# Patient Record
Sex: Female | Born: 1985 | Race: White | Hispanic: No | Marital: Married | Smoking: Never smoker
Health system: Southern US, Community
[De-identification: ages and names within clinical notes are randomized; demographics above are authoritative.]

## PROBLEM LIST (undated history)

## (undated) ENCOUNTER — Inpatient Hospital Stay: Payer: Self-pay

## (undated) DIAGNOSIS — I10 Essential (primary) hypertension: Secondary | ICD-10-CM

## (undated) DIAGNOSIS — F419 Anxiety disorder, unspecified: Secondary | ICD-10-CM

## (undated) HISTORY — PX: TONSILLECTOMY: SUR1361

---

## 2006-08-24 ENCOUNTER — Ambulatory Visit (HOSPITAL_COMMUNITY): Admission: RE | Admit: 2006-08-24 | Discharge: 2006-08-24 | Payer: Self-pay | Admitting: Obstetrics and Gynecology

## 2008-10-14 ENCOUNTER — Ambulatory Visit (HOSPITAL_COMMUNITY): Admission: RE | Admit: 2008-10-14 | Discharge: 2008-10-14 | Payer: Self-pay | Admitting: Obstetrics and Gynecology

## 2008-11-11 ENCOUNTER — Ambulatory Visit (HOSPITAL_COMMUNITY): Admission: RE | Admit: 2008-11-11 | Discharge: 2008-11-11 | Payer: Self-pay | Admitting: Obstetrics and Gynecology

## 2008-11-19 ENCOUNTER — Ambulatory Visit (HOSPITAL_COMMUNITY): Admission: RE | Admit: 2008-11-19 | Discharge: 2008-11-19 | Payer: Self-pay | Admitting: Obstetrics and Gynecology

## 2008-12-17 ENCOUNTER — Ambulatory Visit (HOSPITAL_COMMUNITY): Admission: RE | Admit: 2008-12-17 | Discharge: 2008-12-17 | Payer: Self-pay | Admitting: Obstetrics and Gynecology

## 2009-01-28 ENCOUNTER — Ambulatory Visit (HOSPITAL_COMMUNITY): Admission: RE | Admit: 2009-01-28 | Discharge: 2009-01-28 | Payer: Self-pay | Admitting: Obstetrics and Gynecology

## 2009-02-12 ENCOUNTER — Ambulatory Visit (HOSPITAL_COMMUNITY): Admission: RE | Admit: 2009-02-12 | Discharge: 2009-02-12 | Payer: Self-pay | Admitting: Obstetrics and Gynecology

## 2010-06-29 ENCOUNTER — Encounter: Payer: Self-pay | Admitting: Obstetrics and Gynecology

## 2010-12-03 ENCOUNTER — Encounter: Payer: Self-pay | Admitting: Obstetrics and Gynecology

## 2010-12-21 ENCOUNTER — Encounter: Payer: Self-pay | Admitting: Maternal and Fetal Medicine

## 2011-02-22 ENCOUNTER — Encounter: Payer: Self-pay | Admitting: Maternal & Fetal Medicine

## 2011-05-20 ENCOUNTER — Ambulatory Visit: Payer: Self-pay | Admitting: Obstetrics and Gynecology

## 2011-05-21 ENCOUNTER — Ambulatory Visit: Payer: Self-pay | Admitting: Obstetrics and Gynecology

## 2011-06-18 ENCOUNTER — Ambulatory Visit: Payer: Self-pay

## 2011-06-18 LAB — CBC WITH DIFFERENTIAL/PLATELET
Basophil %: 0.4 %
Eosinophil #: 0.1 10*3/uL (ref 0.0–0.7)
Eosinophil %: 1 %
HGB: 11.8 g/dL — ABNORMAL LOW (ref 12.0–16.0)
Lymphocyte #: 1.6 10*3/uL (ref 1.0–3.6)
MCH: 29.6 pg (ref 26.0–34.0)
MCHC: 33.9 g/dL (ref 32.0–36.0)
MCV: 87 fL (ref 80–100)
Monocyte #: 0.5 10*3/uL (ref 0.0–0.7)
Neutrophil #: 4.3 10*3/uL (ref 1.4–6.5)
RBC: 3.99 10*6/uL (ref 3.80–5.20)
WBC: 6.5 10*3/uL (ref 3.6–11.0)

## 2011-06-24 ENCOUNTER — Inpatient Hospital Stay: Payer: Self-pay

## 2011-06-24 LAB — CBC WITH DIFFERENTIAL/PLATELET
Basophil %: 0.4 %
Eosinophil #: 0 10*3/uL (ref 0.0–0.7)
Eosinophil %: 0.2 %
HCT: 35.1 % (ref 35.0–47.0)
HGB: 11.9 g/dL — ABNORMAL LOW (ref 12.0–16.0)
Lymphocyte %: 25.8 %
MCH: 29.5 pg (ref 26.0–34.0)
MCHC: 33.8 g/dL (ref 32.0–36.0)
Monocyte #: 0.4 10*3/uL (ref 0.0–0.7)
Neutrophil #: 4.9 10*3/uL (ref 1.4–6.5)
Neutrophil %: 68.3 %
RBC: 4.03 10*6/uL (ref 3.80–5.20)

## 2011-06-25 LAB — HEMATOCRIT: HCT: 29 % — ABNORMAL LOW (ref 35.0–47.0)

## 2012-02-26 IMAGING — US ULTRAOUND OB LIMITED - NRPT [facility]
1 series · 14 of 28 positions shown · non-contrast
Comparison: none

[Series 1: ultraound ob limited - nrpt [facility] · 14 of 31 slices shown]
[im 2/31]
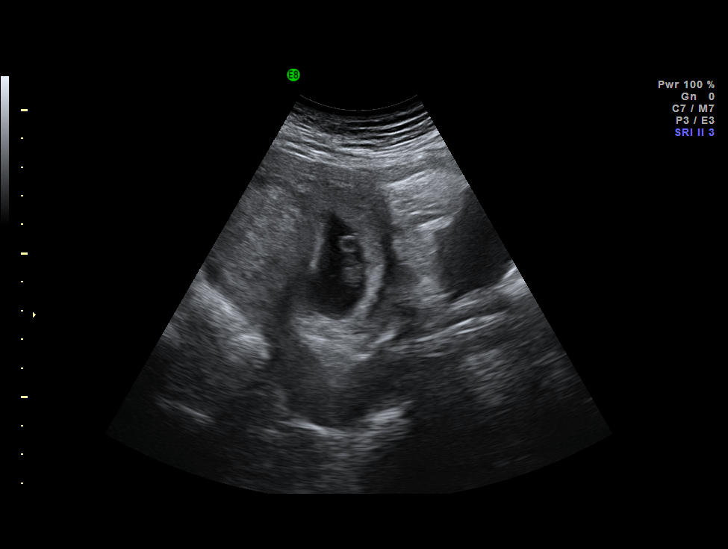
[im 4/31]
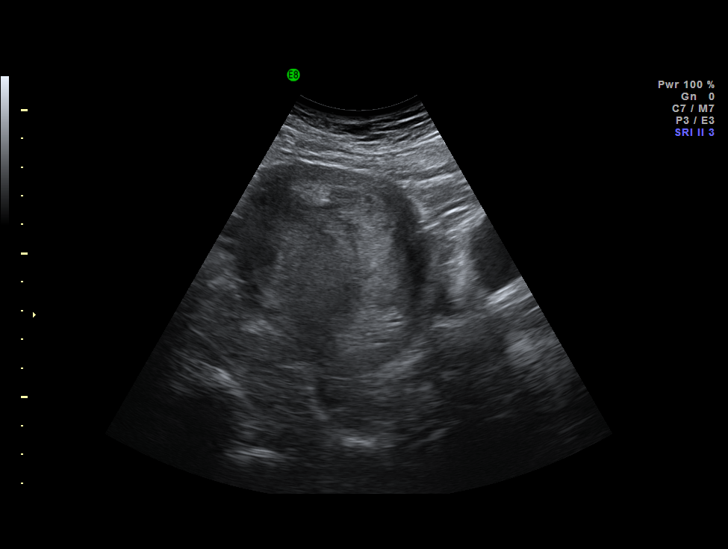
[im 6/31]
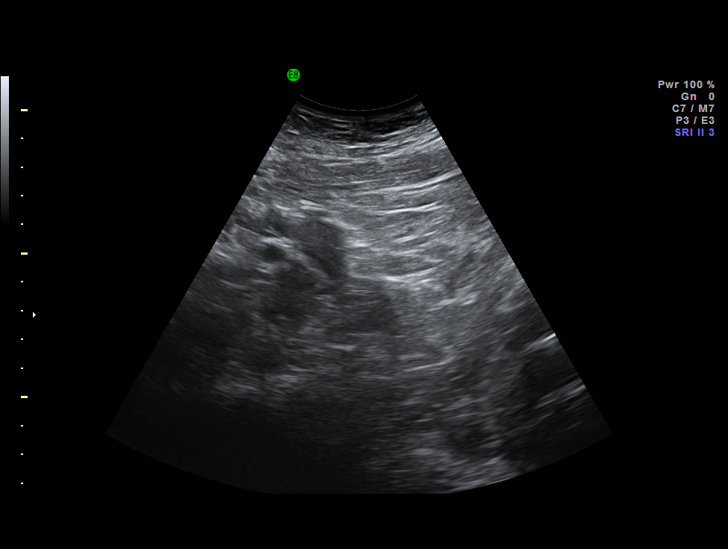
[im 8/31]
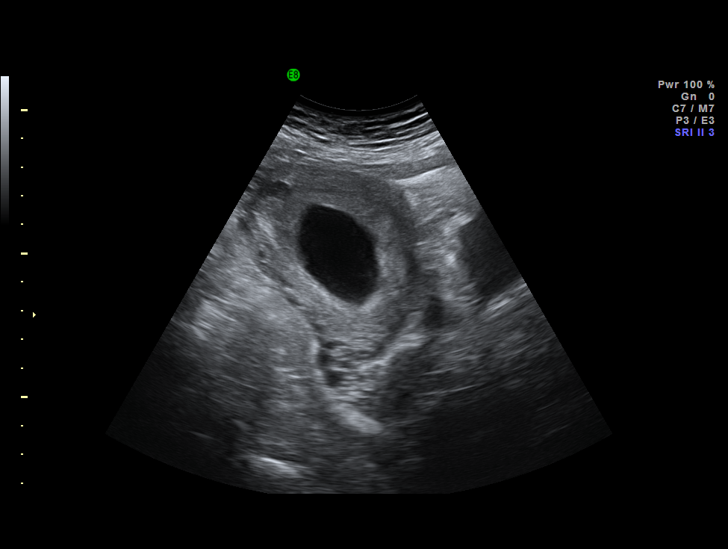
[im 11/31]
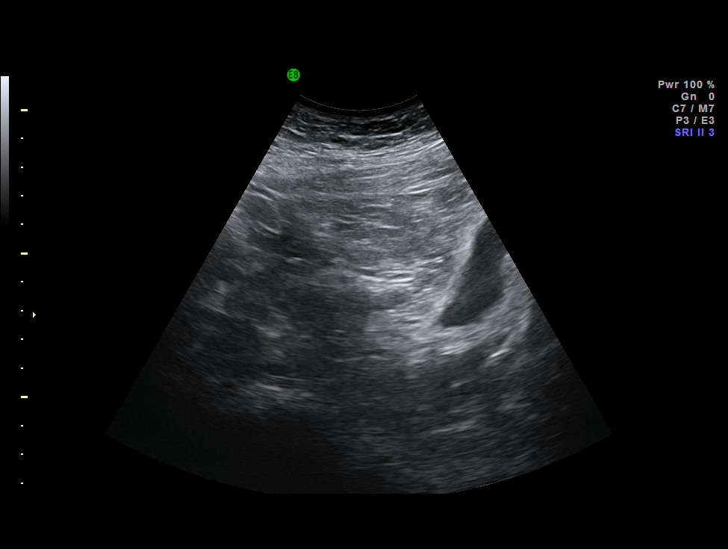
[im 13/31]
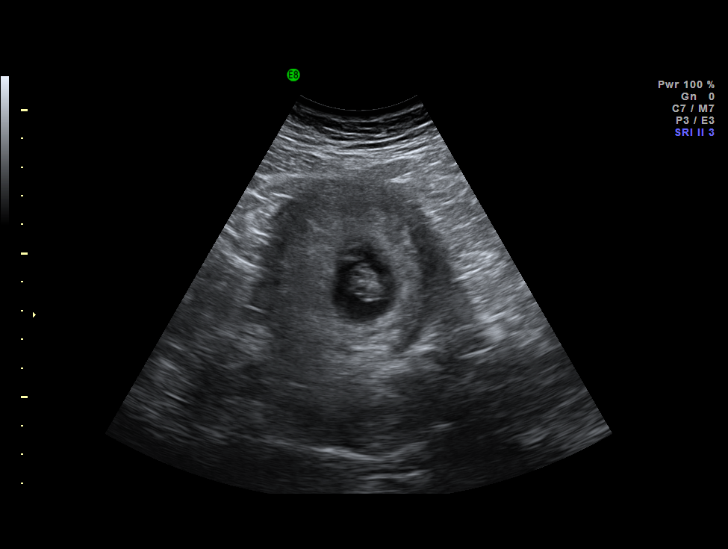
[im 15/31]
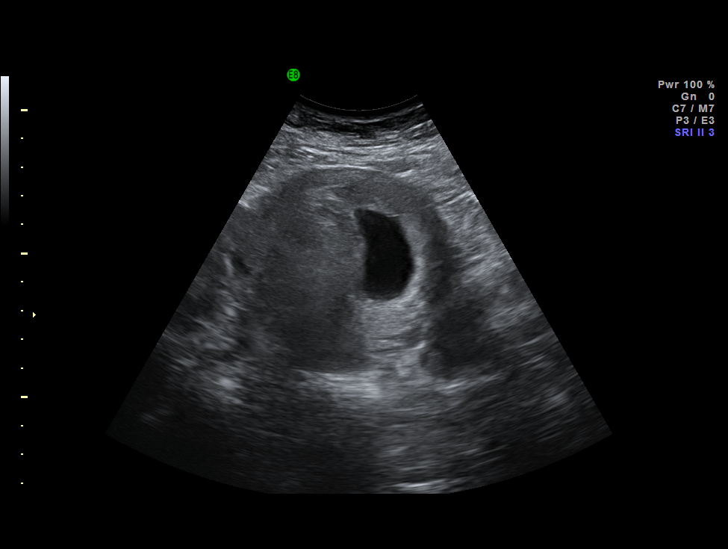
[im 17/31]
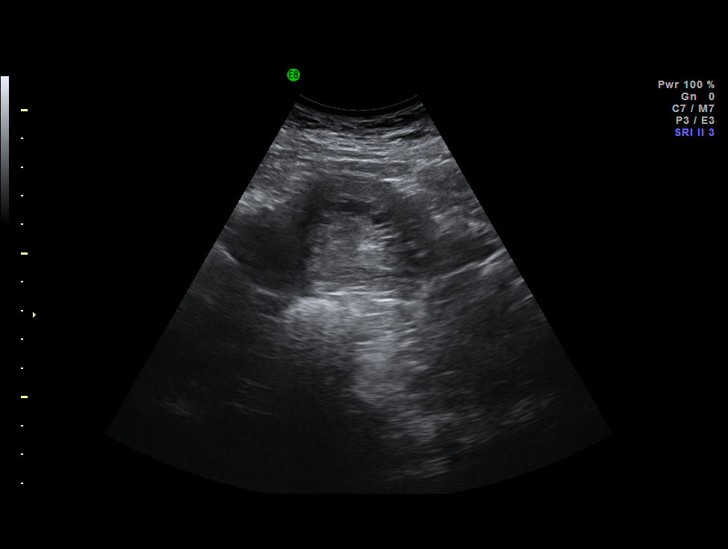
[im 19/31]
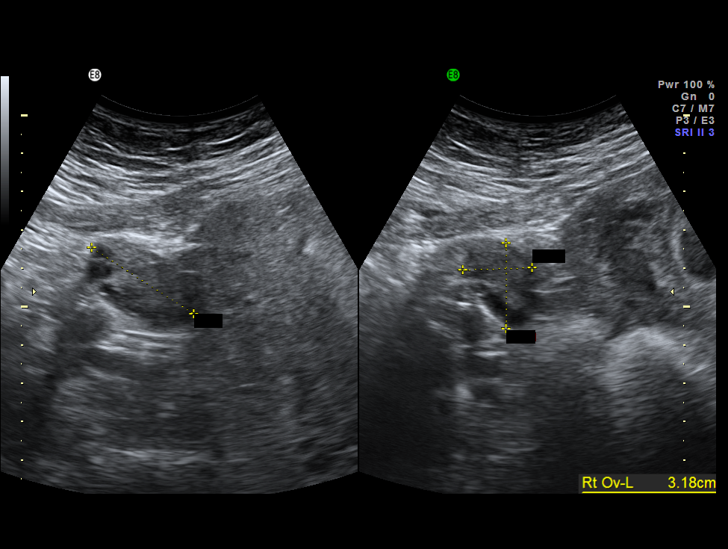
[im 22/31]
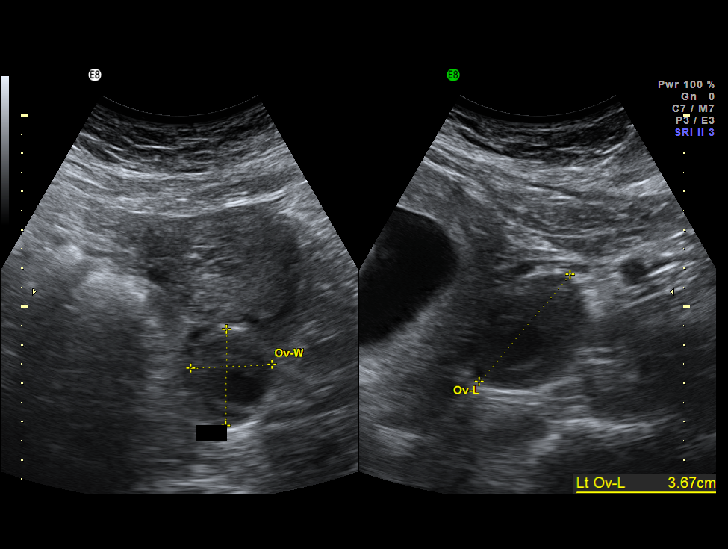
[im 24/31]
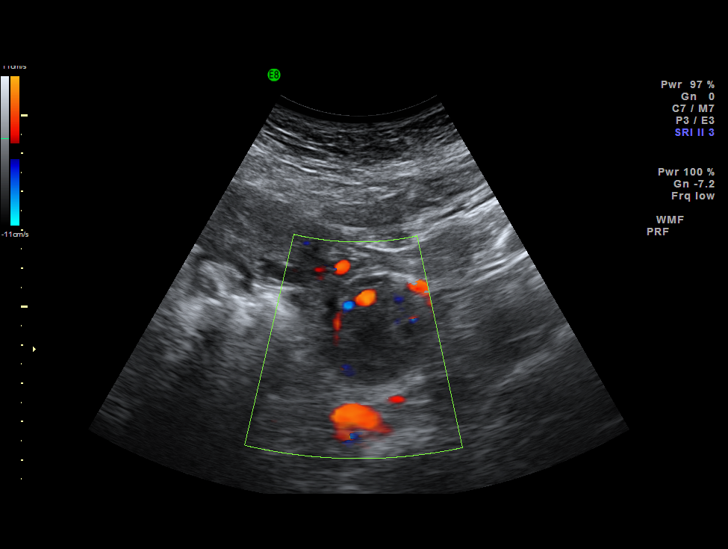
[im 26/31]
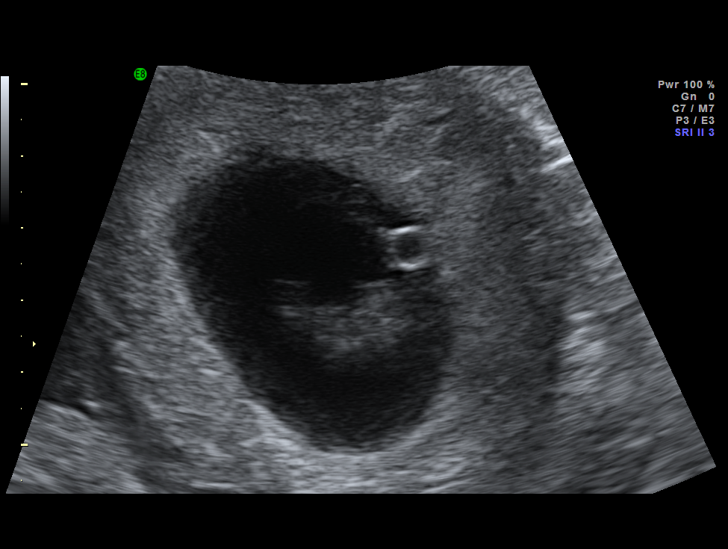
[im 28/31]
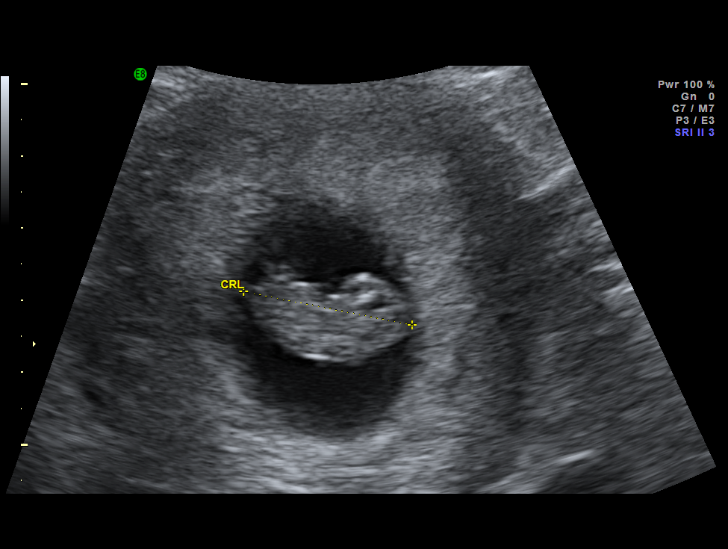
[im 31/31]
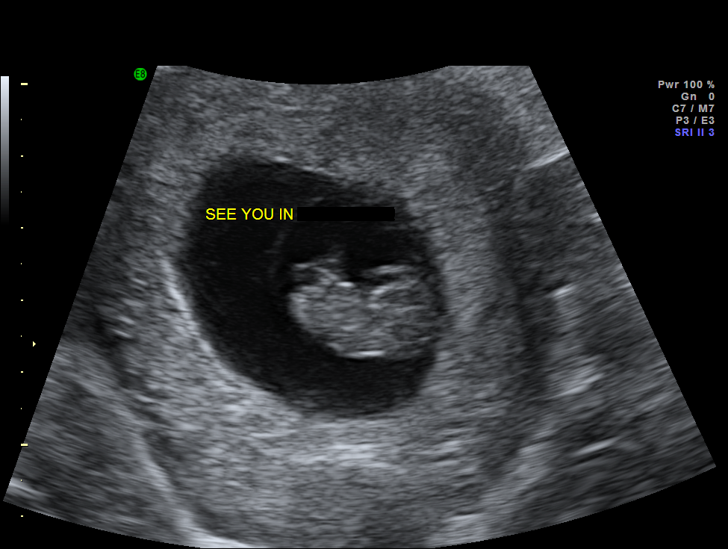

[14 of 28 positions shown; findings below may reference images not displayed]

IMAGES IMPORTED FROM THE [employer] WORKFLOW SYSTEM
NO DICTATION FOR STUDY

## 2012-04-29 IMAGING — US US OB DETAIL+14 WK - NRPT [facility]
1 series · 14 of 28 positions shown · non-contrast
Comparison: none

[Series 1: us ob detail+14 wk - nrpt [facility] · 14 of 86 slices shown]
[im 4/86]
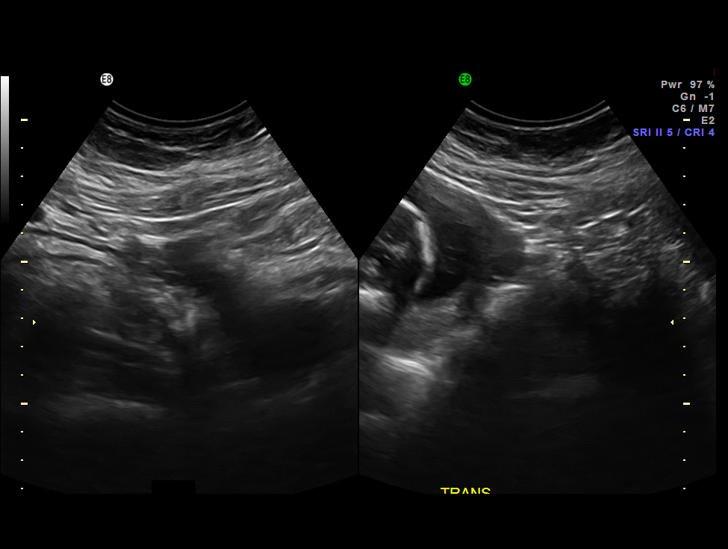
[im 10/86]
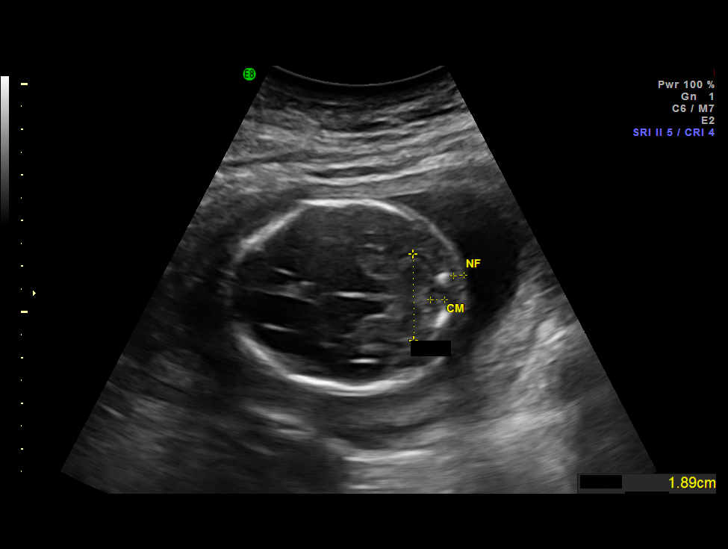
[im 16/86]
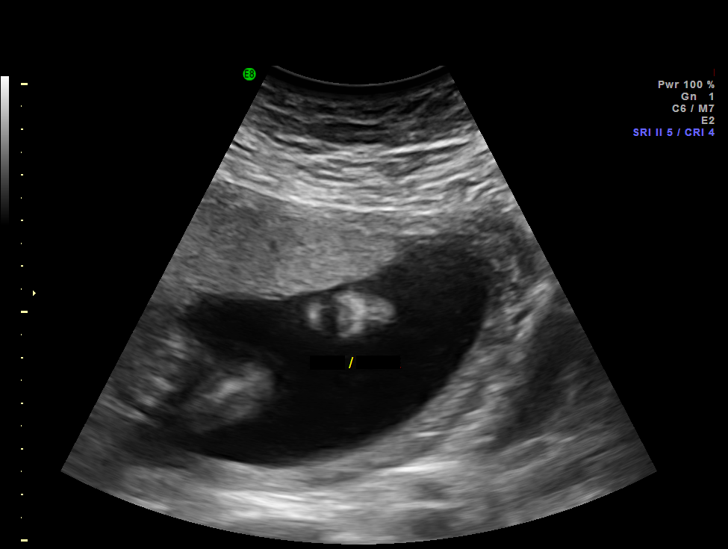
[im 23/86]
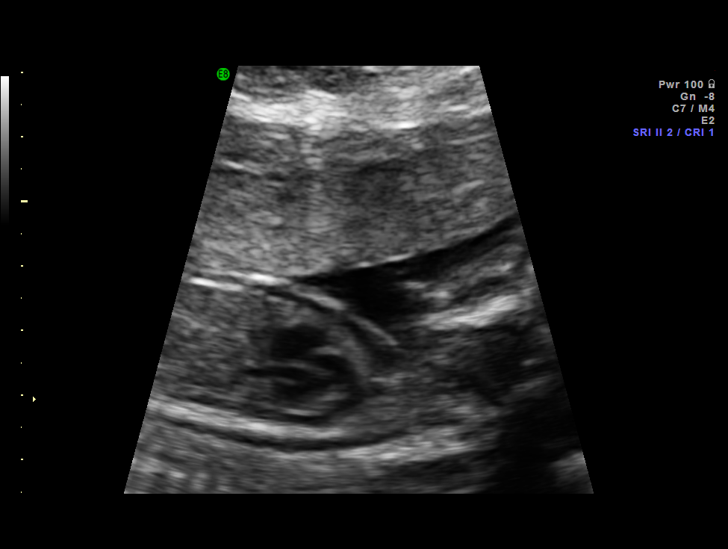
[im 29/86]
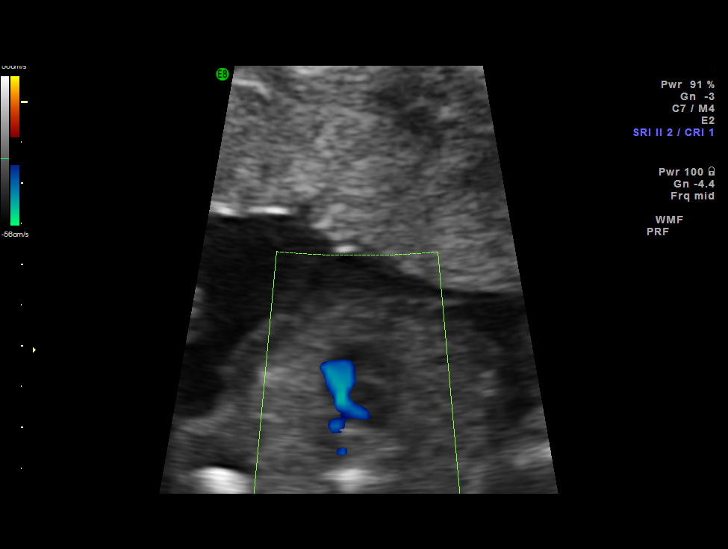
[im 35/86]
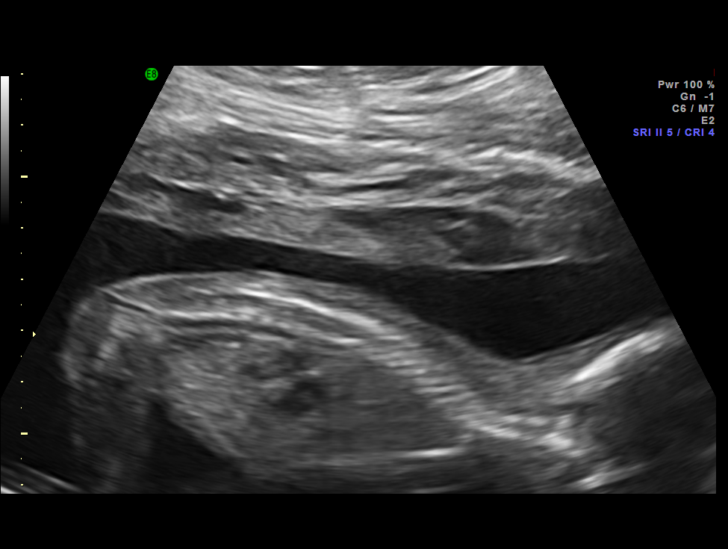
[im 41/86]
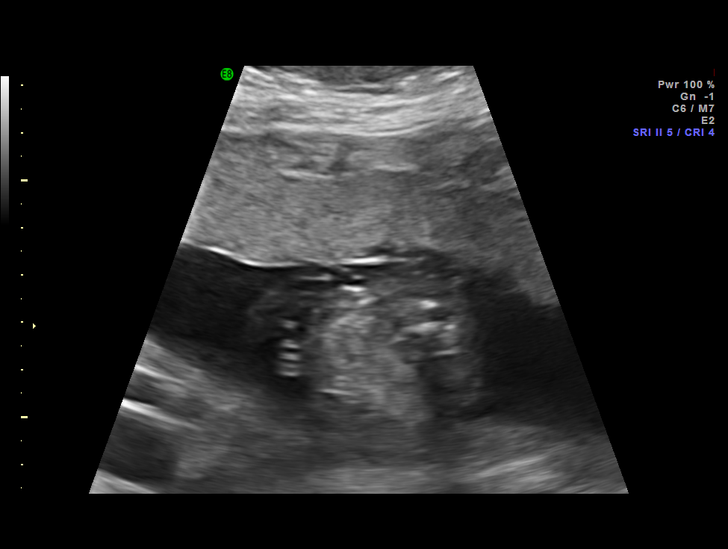
[im 48/86]
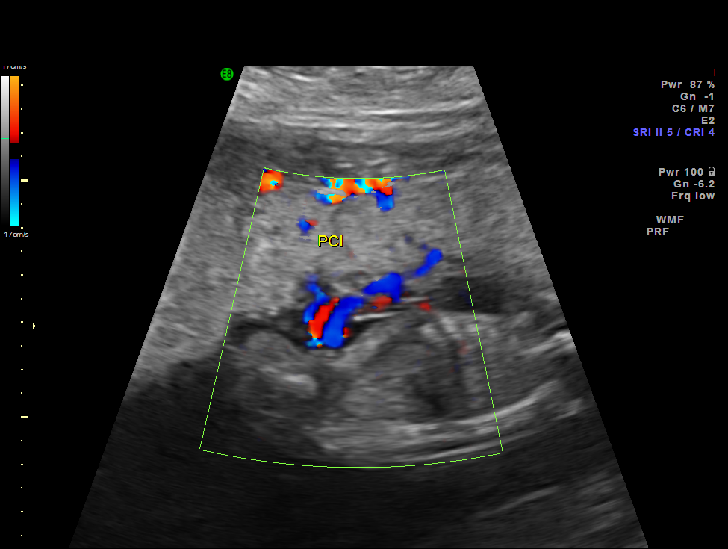
[im 54/86]
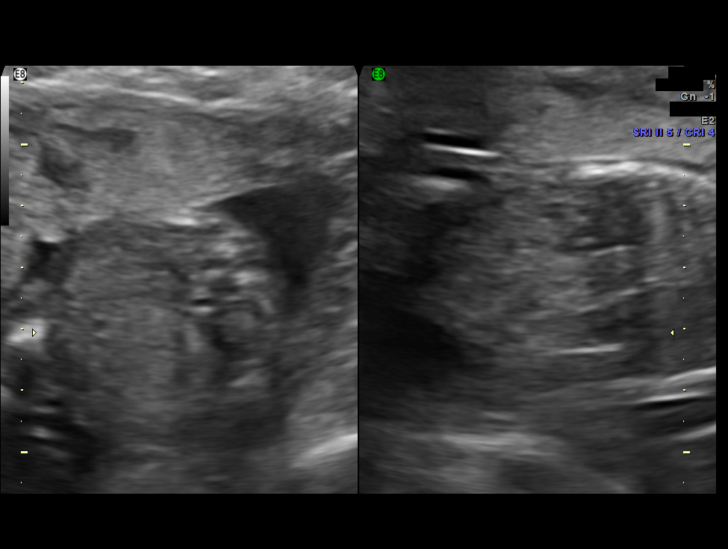
[im 60/86]
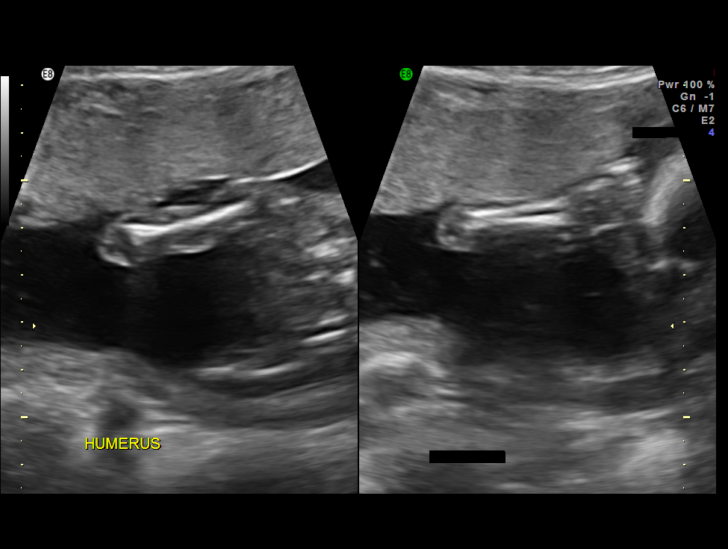
[im 67/86]
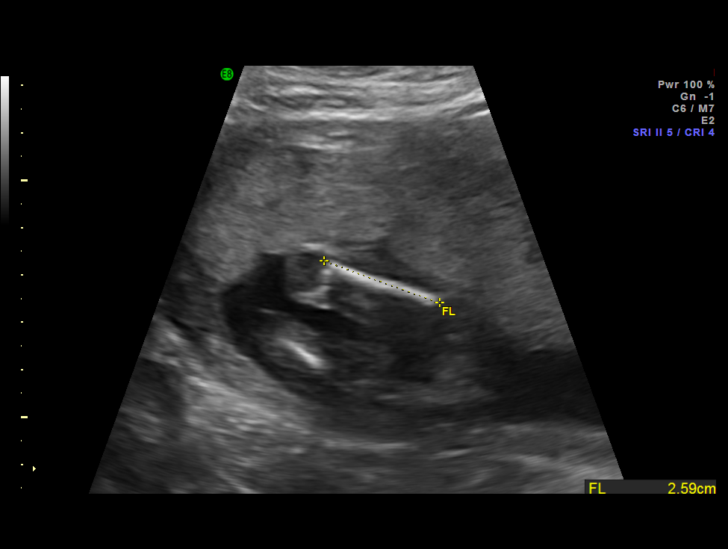
[im 73/86]
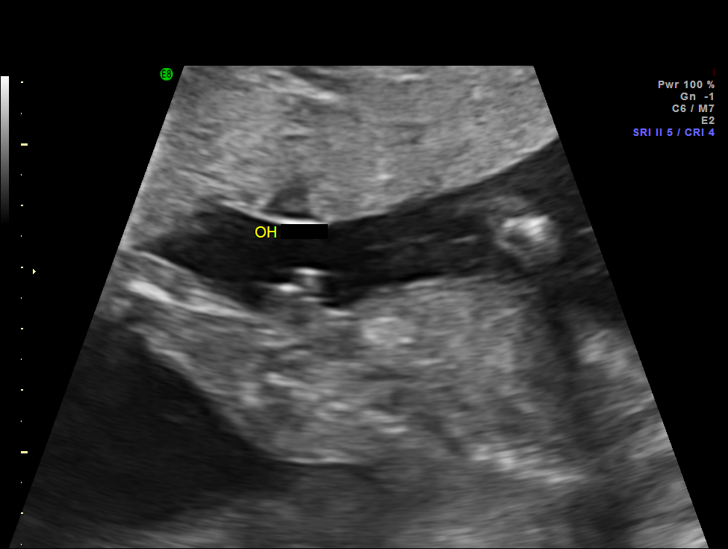
[im 79/86]
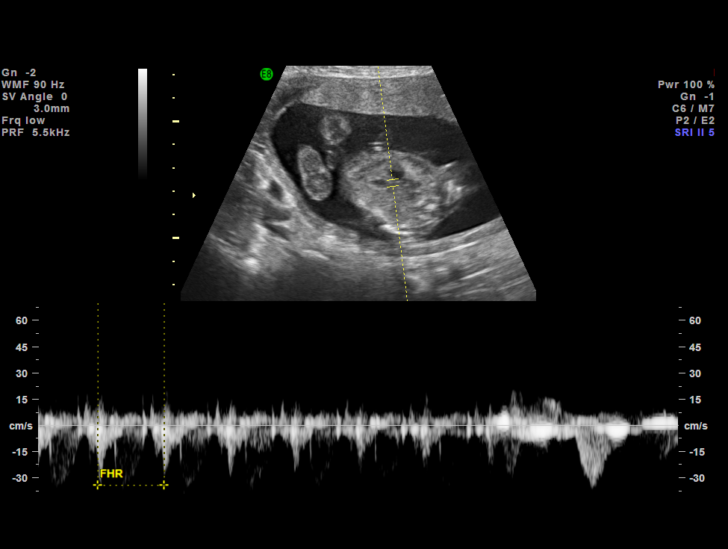
[im 86/86]
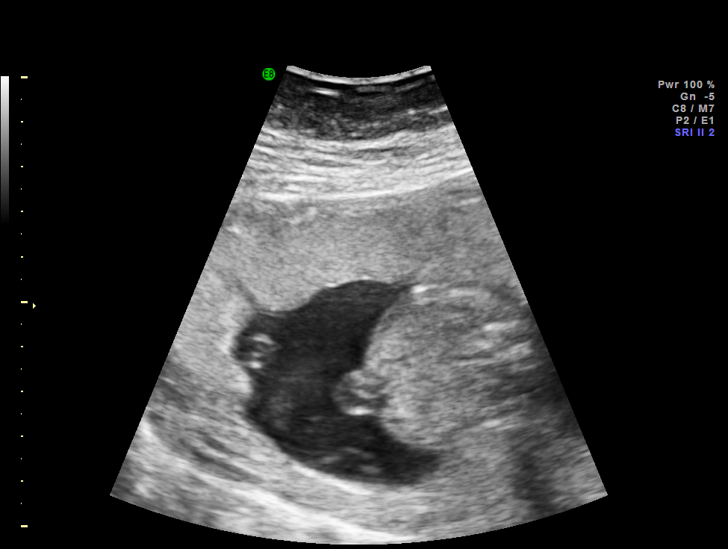

[14 of 28 positions shown; findings below may reference images not displayed]

IMAGES IMPORTED FROM THE [employer] WORKFLOW SYSTEM
NO DICTATION FOR STUDY

## 2014-09-29 NOTE — Op Note (Signed)
PATIENT NAME:  Autumn [name], Autumn [name] MR#:  [id number] DATE OF BIRTH:  02-08-1986  DATE OF PROCEDURE:  06/24/2011  PREOPERATIVE DIAGNOSES:  Intrauterine pregnancy, 36+ weeks' gestation, previous cesarean sections times two, previous pregnancy-induced hypertension.  POSTOPERATIVE DIAGNOSES:  Intrauterine pregnancy, 36+ weeks' gestation, previous cesarean sections times two, previous pregnancy-induced hypertension.   PROCEDURE PERFORMED: Low transverse cesarean section.   SURGEON: Deloris Pinghilip J. Luella Cookosenow, MD  FIRST ASSISTANT:  Dr. Annamarie MajorPaul Harris   PEDIATRICIAN: Dr. Beckie Saltsasnadi   PREOPERATIVE SITUATION: This is [name] 29 year old gravida 3, para 0-2-0-2 has been followed at [facility] for this pregnancy. The patient's previous pregnancies have been terminated via cesarean section, one as [name] classical cesarean section. Both previous pregnancies were complicated by pregnancy-induced hypertension. After careful consideration of the risks of ruptured uterus and the risks of repeat pregnancy-induced hypertension, mutual decision was made to deliver this patient at 36+ weeks.   OPERATIVE FINDINGS: 7 pounds, 5 ounces female infant- Jonah, delivered.   DESCRIPTION OF PROCEDURE: After adequate conduction anesthesia, the patient was prepped and draped in routine fashion. [name] subumbilical midline incision was made through the skin and carried down the various layers and the peritoneal cavity was entered. [name] portion of the inside of the pelvis was amazingly clear. Bladder flap was created and the bladder was pushed down. [name] low transverse incision was made and the above-described infant was delivered without difficulty. The placenta was removed manually. Cervix was "cracked". The uterus was then closed in continuous lock suture of chromic one. All areas of surgery were inspected and found to be hemostatic. Rectus muscles were reapproximated in the midline. The fascia was reapproximated with continuous suture of Maxon. Several triple  plain sutures were placed in the fat, and the skin was closed with skin staples. Estimated blood loss was 500 mL.  The patient tolerated the procedure well and left the operating room in good condition. Sponge and needle counts were said to be correct at the end of the procedure.     ____________________________ Deloris PingPhilip J. Luella Cookosenow, MD pjr:bjt D: 06/24/2011 13:22:14 ET T: 06/24/2011 13:49:59 ET JOB#: [id number]  cc: Deloris PingPhilip J. Luella Cookosenow, MD, <Dictator> Towana BadgerPHILIP J ROSENOW MD ELECTRONICALLY SIGNED 06/25/2011 8:16

## 2014-11-18 LAB — OB RESULTS CONSOLE HEPATITIS B SURFACE ANTIGEN: HEP B S AG: NEGATIVE

## 2014-11-18 LAB — OB RESULTS CONSOLE HIV ANTIBODY (ROUTINE TESTING): HIV: NONREACTIVE

## 2014-11-18 LAB — OB RESULTS CONSOLE RPR: RPR: NONREACTIVE

## 2015-01-12 LAB — OB RESULTS CONSOLE RUBELLA ANTIBODY, IGM: RUBELLA: IMMUNE

## 2015-01-14 ENCOUNTER — Ambulatory Visit
Admission: RE | Admit: 2015-01-14 | Discharge: 2015-01-14 | Disposition: A | Discharge status: Home / Self Care | Payer: Self-pay | Attending: Obstetrics and Gynecology | Admitting: Obstetrics and Gynecology

## 2015-01-14 DIAGNOSIS — Z79899 Other long term (current) drug therapy: Secondary | ICD-10-CM | POA: Insufficient documentation

## 2015-01-14 DIAGNOSIS — O09219 Supervision of pregnancy with history of pre-term labor, unspecified trimester: Secondary | ICD-10-CM | POA: Insufficient documentation

## 2015-01-14 MED ORDER — BETAMETHASONE SOD PHOS & ACET 6 (3-3) MG/ML IJ SUSP
12.0000 mg | Freq: Once | INTRAMUSCULAR | Status: AC
  Administered 2015-01-14: 12 mg via INTRAMUSCULAR

## 2015-01-14 NOTE — Plan of Care (Signed)
Pt arrived to [facility] for Betamethasone injection. She states she has history of three previous early deliveries and previous c/sections.  MD called. Order received to give pt injection and pt is to return tomorrow for next dose.  Pt agrees with plan of care. Med given IM in Right Glut.   Ellison Carwin RNC

## 2015-01-15 ENCOUNTER — Inpatient Hospital Stay
Admission: RE | Admit: 2015-01-15 | Discharge: 2015-01-15 | Disposition: A | Discharge status: Home / Self Care | Payer: Self-pay | Attending: Obstetrics and Gynecology | Admitting: Obstetrics and Gynecology

## 2015-01-15 DIAGNOSIS — Z79899 Other long term (current) drug therapy: Secondary | ICD-10-CM | POA: Insufficient documentation

## 2015-01-15 DIAGNOSIS — O09219 Supervision of pregnancy with history of pre-term labor, unspecified trimester: Secondary | ICD-10-CM | POA: Insufficient documentation

## 2015-01-15 MED ORDER — BETAMETHASONE SOD PHOS & ACET 6 (3-3) MG/ML IJ SUSP
12.0000 mg | Freq: Once | INTRAMUSCULAR | Status: AC
  Administered 2015-01-15: 12 mg via INTRAMUSCULAR

## 2015-01-15 NOTE — Plan of Care (Signed)
Patient arrived for scheduled second dose of betamethasone and complained of flushed feeling, shortness of breath (not severe) unable to sleep and sore. MD notified of reaction and MD order to give medication. Patient requesting BP to be checked, MD notified of results. Educated on patient on what to look for and when to call her provider or return to the ER. Patient states understanding.

## 2015-01-22 ENCOUNTER — Encounter: Payer: Self-pay | Admitting: *Deleted

## 2015-01-22 ENCOUNTER — Observation Stay
Admission: AD | Admit: 2015-01-22 | Discharge: 2015-01-22 | Disposition: A | Discharge status: Home / Self Care | Payer: Self-pay | Attending: Obstetrics and Gynecology | Admitting: Obstetrics and Gynecology

## 2015-01-22 DIAGNOSIS — IMO0001 Reserved for inherently not codable concepts without codable children: Secondary | ICD-10-CM | POA: Diagnosis present

## 2015-01-22 DIAGNOSIS — O26892 Other specified pregnancy related conditions, second trimester: Principal | ICD-10-CM | POA: Insufficient documentation

## 2015-01-22 DIAGNOSIS — Z3A27 27 weeks gestation of pregnancy: Secondary | ICD-10-CM | POA: Insufficient documentation

## 2015-01-22 DIAGNOSIS — R030 Elevated blood-pressure reading, without diagnosis of hypertension: Secondary | ICD-10-CM | POA: Insufficient documentation

## 2015-01-22 HISTORY — DX: Anxiety disorder, unspecified: F41.9

## 2015-01-22 LAB — CBC
HCT: 36.5 % (ref 35.0–47.0)
Hemoglobin: 12.5 g/dL (ref 12.0–16.0)
MCH: 29.9 pg (ref 26.0–34.0)
MCHC: 34.2 g/dL (ref 32.0–36.0)
MCV: 87.3 fL (ref 80.0–100.0)
PLATELETS: 264 10*3/uL (ref 150–440)
RBC: 4.18 MIL/uL (ref 3.80–5.20)
RDW: 13.5 % (ref 11.5–14.5)
WBC: 11.3 10*3/uL — AB (ref 3.6–11.0)

## 2015-01-22 LAB — COMPREHENSIVE METABOLIC PANEL
ALT: 8 U/L — AB (ref 14–54)
AST: 17 U/L (ref 15–41)
Albumin: 2.9 g/dL — ABNORMAL LOW (ref 3.5–5.0)
Alkaline Phosphatase: 58 U/L (ref 38–126)
Anion gap: 8 (ref 5–15)
BUN: 8 mg/dL (ref 6–20)
CHLORIDE: 102 mmol/L (ref 101–111)
CO2: 21 mmol/L — AB (ref 22–32)
CREATININE: 0.47 mg/dL (ref 0.44–1.00)
Calcium: 8.6 mg/dL — ABNORMAL LOW (ref 8.9–10.3)
GFR calc non Af Amer: >60 mL/min (ref >60)
Glucose, Bld: 85 mg/dL (ref 65–99)
Potassium: 3.5 mmol/L (ref 3.5–5.1)
SODIUM: 131 mmol/L — AB (ref 135–145)
Total Bilirubin: 0.6 mg/dL (ref 0.3–1.2)
Total Protein: 6.1 g/dL — ABNORMAL LOW (ref 6.5–8.1)

## 2015-01-22 LAB — PROTEIN / CREATININE RATIO, URINE
Creatinine, Urine: 57 mg/dL
PROTEIN CREATININE RATIO: 0.14 mg/mg{creat} (ref 0.00–0.15)
TOTAL PROTEIN, URINE: 8 mg/dL

## 2015-01-22 MED ORDER — LABETALOL HCL 5 MG/ML IV SOLN
20.0000 mg | INTRAVENOUS | Status: DC | PRN
Start: 2015-01-22 — End: 2015-01-22

## 2015-01-22 MED ORDER — HYDRALAZINE HCL 20 MG/ML IJ SOLN: 10.0000 mg | Freq: Once | INTRAMUSCULAR | Status: DC | PRN

## 2015-01-22 NOTE — OB Triage Provider Note (Signed)
TRIAGE VISIT with NST   Autumn [name] is a 29 y.o. G4P0300. She is at [redacted]w[redacted]d gestation.  Indication: Elevated BP in clinic with hx of severe PreE and HELLP  S: Resting comfortably. no CTX, no VB. Active fetal movement. Denies HA. Endorses bilateral upper quadrant pain with deep breaths. Endorses hand but not feet or face swelling. No change in fetal movement.  O:  BP 131/75 mmHg  Pulse 68  Resp 18  SpO2 100% Results for orders placed or performed during the hospital encounter of 01/22/15 (from the past 48 hour(s))  CBC   Collection Time: 01/22/15 11:44 AM  Result Value Ref Range   WBC 11.3 (H) 3.6 - 11.0 K/uL   RBC 4.18 3.80 - 5.20 MIL/uL   Hemoglobin 12.5 12.0 - 16.0 g/dL   HCT 16.1 09.6 - 04.5 %   MCV 87.3 80.0 - 100.0 fL   MCH 29.9 26.0 - 34.0 pg   MCHC 34.2 32.0 - 36.0 g/dL   RDW 40.9 81.1 - 91.4 %   Platelets 264 150 - 440 K/uL  Comprehensive metabolic panel   Collection Time: 01/22/15 11:44 AM  Result Value Ref Range   Sodium 131 (L) 135 - 145 mmol/L   Potassium 3.5 3.5 - 5.1 mmol/L   Chloride 102 101 - 111 mmol/L   CO2 21 (L) 22 - 32 mmol/L   Glucose, Bld 85 65 - 99 mg/dL   BUN 8 6 - 20 mg/dL   Creatinine, Ser 7.82 0.44 - 1.00 mg/dL   Calcium 8.6 (L) 8.9 - 10.3 mg/dL   Total Protein 6.1 (L) 6.5 - 8.1 g/dL   Albumin 2.9 (L) 3.5 - 5.0 g/dL   AST 17 15 - 41 U/L   ALT 8 (L) 14 - 54 U/L   Alkaline Phosphatase 58 38 - 126 U/L   Total Bilirubin 0.6 0.3 - 1.2 mg/dL   GFR calc non Af Amer >60 >60 mL/min   GFR calc Af Amer >60 >60 mL/min   Anion gap 8 5 - 15  Protein / creatinine ratio, urine   Collection Time: 01/22/15 11:44 AM  Result Value Ref Range   Creatinine, Urine 57 mg/dL   Total Protein, Urine 8 mg/dL   Protein Creatinine Ratio 0.14 0.00 - 0.15 mg/mg[Cre]     Gen: NAD, AAOx3      Abd: FNTTP      Ext: Non-tender, Nonedmeatous    FHT: mod variability, no decels, appropriately reactive for gestational age TOCO: quiet SVE:   deferred   A/P:  29  y.o. G31P0300 [redacted]w[redacted]d with cHTN for r/o PreE.   BP normalized on admission and was never elevated in triage. Her liver enzymes are normal, other PreE labs wnl. Protein:Creatinine ratio 140.  With her hx, close followup is warranted. Will have her recheck BP 2-3x weekly, return in 2 days for check. Strict PreE precautions given. Pt is reliable and trustworthy, especially as her prior hx.  S/p course of steroids.   Fetal Wellbeing: Reassuring Cat 1 tracing.  D/c home stable, precautions reviewed, follow-up as scheduled.

## 2015-01-22 NOTE — Discharge Instructions (Signed)
Drink plenty of fluid and get plenty of rest. Call your provider for any other concerns. Make appointment for Friday at office for BP check.

## 2015-01-22 NOTE — Discharge Planning (Addendum)
Patient discharged home, discharge instructions given, patient states understanding. Patient left floor in stable condition, denies any other needs at this time. Patient to keep next scheduled OB appointment and schedule BP check for friday

## 2015-01-22 NOTE — OB Triage Note (Signed)
Patient sent from office for elevated BP. Patient also states she has felt "off" ever since receiving the BMZ injection.

## 2015-02-22 ENCOUNTER — Inpatient Hospital Stay: Admitting: Anesthesiology

## 2015-02-22 ENCOUNTER — Encounter
Admission: EM | Disposition: A | Discharge status: Home / Self Care | Payer: Self-pay | Source: Home / Self Care | Attending: Obstetrics and Gynecology

## 2015-02-22 ENCOUNTER — Encounter: Payer: Self-pay | Admitting: *Deleted

## 2015-02-22 ENCOUNTER — Inpatient Hospital Stay
Admission: EM | Admit: 2015-02-22 | Discharge: 2015-02-25 | DRG: 765 | Disposition: A | Discharge status: Home / Self Care | Attending: Obstetrics and Gynecology | Admitting: Obstetrics and Gynecology

## 2015-02-22 DIAGNOSIS — D62 Acute posthemorrhagic anemia: Secondary | ICD-10-CM | POA: Diagnosis present

## 2015-02-22 DIAGNOSIS — O9902 Anemia complicating childbirth: Secondary | ICD-10-CM | POA: Diagnosis present

## 2015-02-22 DIAGNOSIS — E871 Hypo-osmolality and hyponatremia: Secondary | ICD-10-CM | POA: Diagnosis present

## 2015-02-22 DIAGNOSIS — O1423 HELLP syndrome (HELLP), third trimester: Secondary | ICD-10-CM | POA: Diagnosis present

## 2015-02-22 DIAGNOSIS — Z7982 Long term (current) use of aspirin: Secondary | ICD-10-CM

## 2015-02-22 DIAGNOSIS — Z3A33 33 weeks gestation of pregnancy: Secondary | ICD-10-CM | POA: Diagnosis present

## 2015-02-22 DIAGNOSIS — O1410 Severe pre-eclampsia, unspecified trimester: Secondary | ICD-10-CM | POA: Diagnosis present

## 2015-02-22 DIAGNOSIS — O99214 Obesity complicating childbirth: Secondary | ICD-10-CM | POA: Diagnosis present

## 2015-02-22 DIAGNOSIS — Z98891 History of uterine scar from previous surgery: Secondary | ICD-10-CM

## 2015-02-22 DIAGNOSIS — Z302 Encounter for sterilization: Secondary | ICD-10-CM | POA: Diagnosis not present

## 2015-02-22 DIAGNOSIS — E669 Obesity, unspecified: Secondary | ICD-10-CM | POA: Diagnosis present

## 2015-02-22 HISTORY — DX: Essential (primary) hypertension: I10

## 2015-02-22 HISTORY — PX: TUBAL LIGATION: SHX77

## 2015-02-22 LAB — COMPREHENSIVE METABOLIC PANEL
ALBUMIN: 2.9 g/dL — AB (ref 3.5–5.0)
ALT: 21 U/L (ref 14–54)
ALT: 61 U/L — AB (ref 14–54)
ALT: 66 U/L — ABNORMAL HIGH (ref 14–54)
ANION GAP: 8 (ref 5–15)
ANION GAP: 9 (ref 5–15)
AST: 48 U/L — ABNORMAL HIGH (ref 15–41)
AST: 142 U/L — ABNORMAL HIGH (ref 15–41)
AST: 132 U/L — ABNORMAL HIGH (ref 15–41)
Albumin: 2.6 g/dL — ABNORMAL LOW (ref 3.5–5.0)
Albumin: 2.6 g/dL — ABNORMAL LOW (ref 3.5–5.0)
Alkaline Phosphatase: 92 U/L (ref 38–126)
Alkaline Phosphatase: 99 U/L (ref 38–126)
Alkaline Phosphatase: 98 U/L (ref 38–126)
Anion gap: 11 (ref 5–15)
BILIRUBIN TOTAL: 0.6 mg/dL (ref 0.3–1.2)
BILIRUBIN TOTAL: 1.6 mg/dL — AB (ref 0.3–1.2)
BUN: 8 mg/dL (ref 6–20)
BUN: 8 mg/dL (ref 6–20)
BUN: 8 mg/dL (ref 6–20)
CHLORIDE: 106 mmol/L (ref 101–111)
CO2: 20 mmol/L — ABNORMAL LOW (ref 22–32)
CO2: 19 mmol/L — ABNORMAL LOW (ref 22–32)
CO2: 21 mmol/L — ABNORMAL LOW (ref 22–32)
CREATININE: 0.61 mg/dL (ref 0.44–1.00)
Calcium: 9.5 mg/dL (ref 8.9–10.3)
Calcium: 8.7 mg/dL — ABNORMAL LOW (ref 8.9–10.3)
Calcium: 7.3 mg/dL — ABNORMAL LOW (ref 8.9–10.3)
Chloride: 108 mmol/L (ref 101–111)
Chloride: 100 mmol/L — ABNORMAL LOW (ref 101–111)
Creatinine, Ser: 0.5 mg/dL (ref 0.44–1.00)
Creatinine, Ser: 0.59 mg/dL (ref 0.44–1.00)
GFR calc Af Amer: >60 mL/min (ref >60)
GFR calc Af Amer: >60 mL/min (ref >60)
GFR calc non Af Amer: >60 mL/min (ref >60)
GLUCOSE: 91 mg/dL (ref 65–99)
Glucose, Bld: 107 mg/dL — ABNORMAL HIGH (ref 65–99)
Glucose, Bld: 140 mg/dL — ABNORMAL HIGH (ref 65–99)
POTASSIUM: 4 mmol/L (ref 3.5–5.1)
POTASSIUM: 3.4 mmol/L — AB (ref 3.5–5.1)
POTASSIUM: 4.3 mmol/L (ref 3.5–5.1)
Sodium: 136 mmol/L (ref 135–145)
Sodium: 136 mmol/L (ref 135–145)
Sodium: 130 mmol/L — ABNORMAL LOW (ref 135–145)
TOTAL PROTEIN: 6.6 g/dL (ref 6.5–8.1)
TOTAL PROTEIN: 5.8 g/dL — AB (ref 6.5–8.1)
TOTAL PROTEIN: 5.8 g/dL — AB (ref 6.5–8.1)
Total Bilirubin: 1.2 mg/dL (ref 0.3–1.2)

## 2015-02-22 LAB — URIC ACID
URIC ACID, SERUM: 6 mg/dL (ref 2.3–6.6)
Uric Acid, Serum: 5.9 mg/dL (ref 2.3–6.6)

## 2015-02-22 LAB — CBC
HEMATOCRIT: 39.5 % (ref 35.0–47.0)
HEMATOCRIT: 38.4 % (ref 35.0–47.0)
HEMOGLOBIN: 13.8 g/dL (ref 12.0–16.0)
HEMOGLOBIN: 13.1 g/dL (ref 12.0–16.0)
MCH: 30.6 pg (ref 26.0–34.0)
MCH: 30.1 pg (ref 26.0–34.0)
MCHC: 34.9 g/dL (ref 32.0–36.0)
MCHC: 34.1 g/dL (ref 32.0–36.0)
MCV: 87.7 fL (ref 80.0–100.0)
MCV: 88.2 fL (ref 80.0–100.0)
Platelets: 239 10*3/uL (ref 150–440)
Platelets: 198 10*3/uL (ref 150–440)
RBC: 4.51 MIL/uL (ref 3.80–5.20)
RBC: 4.36 MIL/uL (ref 3.80–5.20)
RDW: 13.8 % (ref 11.5–14.5)
RDW: 13.8 % (ref 11.5–14.5)
WBC: 11.5 10*3/uL — AB (ref 3.6–11.0)
WBC: 14.8 10*3/uL — ABNORMAL HIGH (ref 3.6–11.0)

## 2015-02-22 LAB — TYPE AND SCREEN
ABO/RH(D): O POS
ANTIBODY SCREEN: NEGATIVE

## 2015-02-22 LAB — MAGNESIUM
MAGNESIUM: 3.8 mg/dL — AB (ref 1.7–2.4)
Magnesium: 5.5 mg/dL — ABNORMAL HIGH (ref 1.7–2.4)

## 2015-02-22 LAB — PROTEIN / CREATININE RATIO, URINE
Creatinine, Urine: 154 mg/dL
PROTEIN CREATININE RATIO: 1.17 mg/mg{creat} — AB (ref 0.00–0.15)
TOTAL PROTEIN, URINE: 180 mg/dL

## 2015-02-22 LAB — ALT: ALT: 35 U/L (ref 14–54)

## 2015-02-22 LAB — AST: AST: 90 U/L — AB (ref 15–41)

## 2015-02-22 LAB — LACTATE DEHYDROGENASE: LDH: 298 U/L — ABNORMAL HIGH (ref 98–192)

## 2015-02-22 LAB — ABO/RH: ABO/RH(D): O POS

## 2015-02-22 LAB — PLATELET COUNT
PLATELETS: 163 10*3/uL (ref 150–440)
Platelets: 222 10*3/uL (ref 150–440)

## 2015-02-22 SURGERY — Surgical Case
Anesthesia: Spinal | Laterality: Bilateral

## 2015-02-22 MED ORDER — PENICILLIN G POTASSIUM 5000000 UNITS IJ SOLR
2.5000 10*6.[IU] | INTRAVENOUS | Status: DC
  Filled 2015-02-22 (×6): qty 2.5

## 2015-02-22 MED ORDER — LACTATED RINGERS IV SOLN: 500.0000 mL | INTRAVENOUS | Status: DC | PRN

## 2015-02-22 MED ORDER — HYDRALAZINE HCL 20 MG/ML IJ SOLN
10.0000 mg | Freq: Once | INTRAMUSCULAR | Status: AC
  Administered 2015-02-22: 10 mg via INTRAVENOUS

## 2015-02-22 MED ORDER — CITRIC ACID-SODIUM CITRATE 334-500 MG/5ML PO SOLN
ORAL | Status: AC
  Administered 2015-02-22: 30 mL via ORAL
  Filled 2015-02-22: qty 15

## 2015-02-22 MED ORDER — MEASLES, MUMPS & RUBELLA VAC ~~LOC~~ INJ
0.5000 mL | INJECTION | Freq: Once | SUBCUTANEOUS | Status: DC
Start: 2015-02-23 — End: 2015-02-25

## 2015-02-22 MED ORDER — LACTATED RINGERS IV SOLN
500.0000 mL | INTRAVENOUS | Status: DC | PRN
Start: 2015-02-22 — End: 2015-02-23

## 2015-02-22 MED ORDER — MENTHOL 3 MG MT LOZG: 1.0000 | LOZENGE | OROMUCOSAL | Status: DC | PRN

## 2015-02-22 MED ORDER — LIDOCAINE HCL (PF) 1 % IJ SOLN
30.0000 mL | INTRAMUSCULAR | Status: DC | PRN
  Filled 2015-02-22: qty 30

## 2015-02-22 MED ORDER — NIFEDIPINE ER 30 MG PO TB24
30.0000 mg | ORAL_TABLET | Freq: Every day | ORAL | Status: DC
  Administered 2015-02-22: 30 mg via ORAL
  Filled 2015-02-22: qty 1

## 2015-02-22 MED ORDER — LIDOCAINE HCL (PF) 1 % IJ SOLN: 30.0000 mL | INTRAMUSCULAR | Status: DC | PRN

## 2015-02-22 MED ORDER — SODIUM CHLORIDE 0.9 % IV SOLN
INTRAVENOUS | Status: DC
  Administered 2015-02-22: via INTRAVENOUS

## 2015-02-22 MED ORDER — OXYTOCIN 40 UNITS IN LACTATED RINGERS INFUSION - SIMPLE MED
INTRAVENOUS | Status: DC | PRN
  Administered 2015-02-22: 1000 mL via INTRAVENOUS

## 2015-02-22 MED ORDER — NALOXONE HCL 0.4 MG/ML IJ SOLN: 0.4000 mg | INTRAMUSCULAR | Status: DC | PRN

## 2015-02-22 MED ORDER — LABETALOL HCL 5 MG/ML IV SOLN
INTRAVENOUS | Status: DC | PRN
  Administered 2015-02-22: 5 mg via INTRAVENOUS

## 2015-02-22 MED ORDER — BUPIVACAINE IN DEXTROSE 0.75-8.25 % IT SOLN
INTRATHECAL | Status: DC | PRN
  Administered 2015-02-22: 1.6 mL via INTRATHECAL

## 2015-02-22 MED ORDER — OXYCODONE-ACETAMINOPHEN 5-325 MG PO TABS
1.0000 | ORAL_TABLET | ORAL | Status: DC | PRN
  Administered 2015-02-24 – 2015-02-25 (×4): 1 via ORAL
  Filled 2015-02-22 (×4): qty 1

## 2015-02-22 MED ORDER — MORPHINE SULFATE (PF) 0.5 MG/ML IJ SOLN
INTRAMUSCULAR | Status: DC | PRN
  Administered 2015-02-22: .2 mg via INTRATHECAL

## 2015-02-22 MED ORDER — SENNOSIDES-DOCUSATE SODIUM 8.6-50 MG PO TABS
2.0000 | ORAL_TABLET | ORAL | Status: DC
  Administered 2015-02-24 – 2015-02-25 (×2): 2 via ORAL
  Filled 2015-02-22 (×2): qty 2

## 2015-02-22 MED ORDER — KETOROLAC TROMETHAMINE 30 MG/ML IJ SOLN
30.0000 mg | Freq: Four times a day (QID) | INTRAMUSCULAR | Status: AC | PRN
  Administered 2015-02-22 – 2015-02-23 (×2): 30 mg via INTRAVENOUS
  Filled 2015-02-22 (×2): qty 1

## 2015-02-22 MED ORDER — DIBUCAINE 1 % RE OINT: 1.0000 "application " | TOPICAL_OINTMENT | RECTAL | Status: DC | PRN

## 2015-02-22 MED ORDER — NALBUPHINE HCL 10 MG/ML IJ SOLN
5.0000 mg | Freq: Once | INTRAMUSCULAR | Status: DC | PRN
  Filled 2015-02-22: qty 0.5

## 2015-02-22 MED ORDER — ALPRAZOLAM 1 MG PO TABS: 1.0000 mg | ORAL_TABLET | Freq: Three times a day (TID) | ORAL | Status: DC | PRN

## 2015-02-22 MED ORDER — CEFAZOLIN SODIUM-DEXTROSE 2-3 GM-% IV SOLR
INTRAVENOUS | Status: AC
  Filled 2015-02-22: qty 50

## 2015-02-22 MED ORDER — DEXTROSE 5 % IV SOLN
3.0000 g | Freq: Three times a day (TID) | INTRAVENOUS | Status: DC
  Administered 2015-02-22 – 2015-02-23 (×3): 3 g via INTRAVENOUS
  Filled 2015-02-22 (×9): qty 3000

## 2015-02-22 MED ORDER — PENICILLIN G POTASSIUM 5000000 UNITS IJ SOLR
5.0000 10*6.[IU] | Freq: Once | INTRAVENOUS | Status: DC
  Filled 2015-02-22: qty 5

## 2015-02-22 MED ORDER — DEXTROSE 5 % IV SOLN
5.0000 10*6.[IU] | Freq: Once | INTRAVENOUS | Status: DC
  Filled 2015-02-22: qty 5

## 2015-02-22 MED ORDER — OXYTOCIN 40 UNITS IN LACTATED RINGERS INFUSION - SIMPLE MED
62.5000 mL/h | INTRAVENOUS | Status: DC
  Filled 2015-02-22: qty 1000

## 2015-02-22 MED ORDER — BUTORPHANOL TARTRATE 1 MG/ML IJ SOLN: 1.0000 mg | INTRAMUSCULAR | Status: DC | PRN

## 2015-02-22 MED ORDER — MEPERIDINE HCL 25 MG/ML IJ SOLN: 6.2500 mg | INTRAMUSCULAR | Status: DC | PRN

## 2015-02-22 MED ORDER — NALBUPHINE HCL 10 MG/ML IJ SOLN
5.0000 mg | INTRAMUSCULAR | Status: DC | PRN
  Filled 2015-02-22: qty 0.5

## 2015-02-22 MED ORDER — LABETALOL HCL 5 MG/ML IV SOLN: 20.0000 mg | INTRAVENOUS | Status: DC | PRN

## 2015-02-22 MED ORDER — CITRIC ACID-SODIUM CITRATE 334-500 MG/5ML PO SOLN: 30.0000 mL | ORAL | Status: DC | PRN

## 2015-02-22 MED ORDER — DIPHENHYDRAMINE HCL 25 MG PO CAPS: 25.0000 mg | ORAL_CAPSULE | Freq: Four times a day (QID) | ORAL | Status: DC | PRN

## 2015-02-22 MED ORDER — LABETALOL HCL 200 MG PO TABS
200.0000 mg | ORAL_TABLET | Freq: Three times a day (TID) | ORAL | Status: DC
  Administered 2015-02-22 – 2015-02-25 (×7): 200 mg via ORAL
  Filled 2015-02-22 (×7): qty 1

## 2015-02-22 MED ORDER — TETANUS-DIPHTH-ACELL PERTUSSIS 5-2.5-18.5 LF-MCG/0.5 IM SUSP: 0.5000 mL | Freq: Once | INTRAMUSCULAR | Status: DC

## 2015-02-22 MED ORDER — LABETALOL HCL 5 MG/ML IV SOLN
20.0000 mg | Freq: Once | INTRAVENOUS | Status: AC
Start: 2015-02-22 — End: 2015-02-22
  Administered 2015-02-22: 20 mg via INTRAVENOUS

## 2015-02-22 MED ORDER — ONDANSETRON HCL 4 MG/2ML IJ SOLN
INTRAMUSCULAR | Status: DC | PRN
  Administered 2015-02-22: 4 mg via INTRAVENOUS

## 2015-02-22 MED ORDER — SIMETHICONE 80 MG PO CHEW
80.0000 mg | CHEWABLE_TABLET | ORAL | Status: DC
  Administered 2015-02-24 – 2015-02-25 (×2): 80 mg via ORAL
  Filled 2015-02-22 (×2): qty 1

## 2015-02-22 MED ORDER — HYDRALAZINE HCL 20 MG/ML IJ SOLN: 10.0000 mg | Freq: Once | INTRAMUSCULAR | Status: DC | PRN

## 2015-02-22 MED ORDER — FLEET ENEMA 7-19 GM/118ML RE ENEM: 1.0000 | ENEMA | Freq: Every day | RECTAL | Status: DC | PRN

## 2015-02-22 MED ORDER — BUPIVACAINE HCL (PF) 0.5 % IJ SOLN
INTRAMUSCULAR | Status: AC
  Filled 2015-02-22: qty 30

## 2015-02-22 MED ORDER — DIPHENHYDRAMINE HCL 25 MG PO CAPS: 25.0000 mg | ORAL_CAPSULE | ORAL | Status: DC | PRN

## 2015-02-22 MED ORDER — LACTATED RINGERS IV SOLN
INTRAVENOUS | Status: DC
  Administered 2015-02-22: 15:00:00 via INTRAVENOUS

## 2015-02-22 MED ORDER — ACETAMINOPHEN 325 MG PO TABS: 650.0000 mg | ORAL_TABLET | ORAL | Status: DC | PRN

## 2015-02-22 MED ORDER — OXYTOCIN BOLUS FROM INFUSION: 500.0000 mL | INTRAVENOUS | Status: DC

## 2015-02-22 MED ORDER — CITRIC ACID-SODIUM CITRATE 334-500 MG/5ML PO SOLN
30.0000 mL | ORAL | Status: DC | PRN
  Administered 2015-02-22: 30 mL via ORAL

## 2015-02-22 MED ORDER — FENTANYL CITRATE (PF) 100 MCG/2ML IJ SOLN: 25.0000 ug | INTRAMUSCULAR | Status: DC | PRN

## 2015-02-22 MED ORDER — LACTATED RINGERS IV SOLN
INTRAVENOUS | Status: DC
  Administered 2015-02-22: 11:00:00 via INTRAVENOUS

## 2015-02-22 MED ORDER — ONDANSETRON HCL 4 MG/2ML IJ SOLN
4.0000 mg | Freq: Four times a day (QID) | INTRAMUSCULAR | Status: DC | PRN
  Filled 2015-02-22: qty 2

## 2015-02-22 MED ORDER — HYDRALAZINE HCL 20 MG/ML IJ SOLN
INTRAMUSCULAR | Status: AC
Start: 2015-02-22 — End: 2015-02-22
  Administered 2015-02-22: 10 mg via INTRAVENOUS
  Filled 2015-02-22: qty 1

## 2015-02-22 MED ORDER — WITCH HAZEL-GLYCERIN EX PADS: 1.0000 "application " | MEDICATED_PAD | CUTANEOUS | Status: DC | PRN

## 2015-02-22 MED ORDER — LANOLIN HYDROUS EX OINT: 1.0000 "application " | TOPICAL_OINTMENT | CUTANEOUS | Status: DC | PRN

## 2015-02-22 MED ORDER — BUPIVACAINE 0.25 % ON-Q PUMP DUAL CATH 400 ML
INJECTION | Status: AC
  Filled 2015-02-22: qty 400

## 2015-02-22 MED ORDER — LABETALOL HCL 200 MG PO TABS
ORAL_TABLET | ORAL | Status: AC
  Administered 2015-02-22: 200 mg via ORAL
  Filled 2015-02-22: qty 1

## 2015-02-22 MED ORDER — LABETALOL HCL 5 MG/ML IV SOLN
INTRAVENOUS | Status: AC
  Administered 2015-02-22: 20 mg via INTRAVENOUS
  Filled 2015-02-22: qty 4

## 2015-02-22 MED ORDER — EPHEDRINE SULFATE 50 MG/ML IJ SOLN
INTRAMUSCULAR | Status: DC | PRN
  Administered 2015-02-22 (×2): 10 mg via INTRAVENOUS

## 2015-02-22 MED ORDER — ONDANSETRON HCL 4 MG/2ML IJ SOLN
4.0000 mg | Freq: Once | INTRAMUSCULAR | Status: AC | PRN
  Administered 2015-02-22: 4 mg via INTRAVENOUS

## 2015-02-22 MED ORDER — SODIUM CHLORIDE 0.9 % NICU IV INFUSION SIMPLE
INJECTION | INTRAVENOUS | Status: DC
  Filled 2015-02-22 (×10): qty 500

## 2015-02-22 MED ORDER — PENICILLIN G POTASSIUM 5000000 UNITS IJ SOLR
2.5000 10*6.[IU] | INTRAVENOUS | Status: DC
  Filled 2015-02-22 (×7): qty 2.5

## 2015-02-22 MED ORDER — ONDANSETRON HCL 4 MG/2ML IJ SOLN: 4.0000 mg | Freq: Three times a day (TID) | INTRAMUSCULAR | Status: DC | PRN

## 2015-02-22 MED ORDER — OXYTOCIN 40 UNITS IN LACTATED RINGERS INFUSION - SIMPLE MED: 62.5000 mL/h | INTRAVENOUS | Status: DC

## 2015-02-22 MED ORDER — DEXTROSE 5 % IV SOLN
1.0000 ug/kg/h | INTRAVENOUS | Status: DC | PRN
  Filled 2015-02-22: qty 2

## 2015-02-22 MED ORDER — KETOROLAC TROMETHAMINE 30 MG/ML IJ SOLN: 30.0000 mg | Freq: Four times a day (QID) | INTRAMUSCULAR | Status: AC | PRN

## 2015-02-22 MED ORDER — IBUPROFEN 600 MG PO TABS
600.0000 mg | ORAL_TABLET | Freq: Four times a day (QID) | ORAL | Status: DC
  Administered 2015-02-24 – 2015-02-25 (×6): 600 mg via ORAL
  Filled 2015-02-22 (×6): qty 1

## 2015-02-22 MED ORDER — HYDRALAZINE HCL 20 MG/ML IJ SOLN
10.0000 mg | Freq: Once | INTRAMUSCULAR | Status: AC
  Administered 2015-02-22: 10 mg via INTRAVENOUS
  Filled 2015-02-22: qty 1

## 2015-02-22 MED ORDER — PRENATAL MULTIVITAMIN CH
1.0000 | ORAL_TABLET | Freq: Every day | ORAL | Status: DC
  Administered 2015-02-24: 1 via ORAL
  Filled 2015-02-22 (×2): qty 1

## 2015-02-22 MED ORDER — OXYCODONE-ACETAMINOPHEN 5-325 MG PO TABS
2.0000 | ORAL_TABLET | ORAL | Status: DC | PRN
  Administered 2015-02-23: 2 via ORAL
  Filled 2015-02-22: qty 2

## 2015-02-22 MED ORDER — DIPHENHYDRAMINE HCL 50 MG/ML IJ SOLN: 12.5000 mg | INTRAMUSCULAR | Status: DC | PRN

## 2015-02-22 MED ORDER — ONDANSETRON HCL 4 MG/2ML IJ SOLN: 4.0000 mg | Freq: Four times a day (QID) | INTRAMUSCULAR | Status: DC | PRN

## 2015-02-22 MED ORDER — SODIUM CHLORIDE 0.9 % IJ SOLN: 3.0000 mL | INTRAMUSCULAR | Status: DC | PRN

## 2015-02-22 MED ORDER — LACTATED RINGERS IV SOLN: INTRAVENOUS | Status: DC

## 2015-02-22 MED ORDER — SIMETHICONE 80 MG PO CHEW
80.0000 mg | CHEWABLE_TABLET | ORAL | Status: DC | PRN
  Filled 2015-02-22: qty 1

## 2015-02-22 MED ORDER — SCOPOLAMINE 1 MG/3DAYS TD PT72: 1.0000 | MEDICATED_PATCH | Freq: Once | TRANSDERMAL | Status: DC

## 2015-02-22 MED ORDER — BUPIVACAINE ON-Q PAIN PUMP (FOR ORDER SET NO CHG): INJECTION | Status: DC

## 2015-02-22 MED ORDER — SIMETHICONE 80 MG PO CHEW
80.0000 mg | CHEWABLE_TABLET | Freq: Three times a day (TID) | ORAL | Status: DC
  Administered 2015-02-23 – 2015-02-25 (×2): 80 mg via ORAL
  Filled 2015-02-22: qty 1

## 2015-02-22 MED ORDER — LACTATED RINGERS IV SOLN
3.0000 g/h | INTRAVENOUS | Status: DC
  Administered 2015-02-22: 2 g/h via INTRAVENOUS
  Administered 2015-02-23: 3 g/h via INTRAVENOUS
  Filled 2015-02-22 (×2): qty 80

## 2015-02-22 MED ORDER — BISACODYL 10 MG RE SUPP: 10.0000 mg | Freq: Every day | RECTAL | Status: DC | PRN

## 2015-02-22 MED ORDER — HYDRALAZINE HCL 10 MG PO TABS
10.0000 mg | ORAL_TABLET | ORAL | Status: DC | PRN
  Filled 2015-02-22: qty 1

## 2015-02-22 MED ORDER — MAGNESIUM SULFATE 4 GM/100ML IV SOLN
4.0000 g | Freq: Once | INTRAVENOUS | Status: AC
  Administered 2015-02-22: 4 g via INTRAVENOUS
  Filled 2015-02-22: qty 100

## 2015-02-22 MED ORDER — OXYTOCIN 40 UNITS IN LACTATED RINGERS INFUSION - SIMPLE MED: 62.5000 mL/h | INTRAVENOUS | Status: AC

## 2015-02-22 MED ORDER — BETAMETHASONE SOD PHOS & ACET 6 (3-3) MG/ML IJ SUSP: 12.0000 mg | Freq: Once | INTRAMUSCULAR | Status: DC

## 2015-02-22 SURGICAL SUPPLY — 23 items
BARRIER ADHS 3X4 INTERCEED (GAUZE/BANDAGES/DRESSINGS) ×3 IMPLANT
BRR ADH 4X3 ABS CNTRL BYND (GAUZE/BANDAGES/DRESSINGS) ×2
CANISTER SUCT 3000ML (MISCELLANEOUS) ×4 IMPLANT
CATH KIT ON-Q SILVERSOAK 5 (CATHETERS) ×2 IMPLANT
CATH KIT ON-Q SILVERSOAK 5IN (CATHETERS) IMPLANT
CHLORAPREP W/TINT 26ML (MISCELLANEOUS) ×7 IMPLANT
DRSG TELFA 3X8 NADH (GAUZE/BANDAGES/DRESSINGS) ×4 IMPLANT
GAUZE SPONGE 4X4 12PLY STRL (GAUZE/BANDAGES/DRESSINGS) ×4 IMPLANT
GOWN STRL REUS W/ TWL LRG LVL3 (GOWN DISPOSABLE) ×6 IMPLANT
GOWN STRL REUS W/TWL LRG LVL3 (GOWN DISPOSABLE) ×12
NS IRRIG 1000ML POUR BTL (IV SOLUTION) ×4 IMPLANT
PAD DRESSING TELFA 3X8 NADH (GAUZE/BANDAGES/DRESSINGS) ×1 IMPLANT
PAD GROUND ADULT SPLIT (MISCELLANEOUS) ×4 IMPLANT
PAD OB MATERNITY 4.3X12.25 (PERSONAL CARE ITEMS) ×4 IMPLANT
PAD PREP 24X41 OB/GYN DISP (PERSONAL CARE ITEMS) ×4 IMPLANT
SUT MNCRL 4-0 (SUTURE)
SUT MNCRL 4-0 27XMFL (SUTURE)
SUT PLAIN GUT 2-0 30 C14 SG823 (SUTURE) ×4
SUT VIC AB 0 CT1 36 (SUTURE) ×12 IMPLANT
SUT VIC AB 3-0 SH 27 (SUTURE) ×4
SUT VIC AB 3-0 SH 27X BRD (SUTURE) ×2 IMPLANT
SUTURE MNCRL 4-0 27XMF (SUTURE) IMPLANT
SUTURE PLN GUT2-0 30 C14 SG823 (SUTURE) ×2 IMPLANT

## 2015-02-22 NOTE — Anesthesia Preprocedure Evaluation (Signed)
Anesthesia Evaluation  Patient identified by MRN, date of birth, ID band Patient awake    Reviewed: Allergy & Precautions, NPO status , Patient's Chart, lab work & pertinent test results, reviewed documented beta blocker date and time   Airway Mallampati: III  TM Distance: >3 FB     Dental  (+) Chipped   Pulmonary           Cardiovascular hypertension,      Neuro/Psych    GI/Hepatic   Endo/Other    Renal/GU      Musculoskeletal   Abdominal   Peds  Hematology   Anesthesia Other Findings   Reproductive/Obstetrics                             Anesthesia Physical Anesthesia Plan  ASA: II  Anesthesia Plan: Spinal   Post-op Pain Management:    Induction:   Airway Management Planned:   Additional Equipment:   Intra-op Plan:   Post-operative Plan:   Informed Consent: I have reviewed the patients History and Physical, chart, labs and discussed the procedure including the risks, benefits and alternatives for the proposed anesthesia with the patient or authorized representative who has indicated his/her understanding and acceptance.     Plan Discussed with: CRNA  Anesthesia Plan Comments:         Anesthesia Quick Evaluation

## 2015-02-22 NOTE — Anesthesia Procedure Notes (Signed)
Spinal Patient location during procedure: OR Staffing Anesthesiologist: Berdine Addison Performed by: anesthesiologist  Preanesthetic Checklist Completed: patient identified, site marked, surgical consent, pre-op evaluation, timeout performed, IV checked and risks and benefits discussed Spinal Block Patient position: sitting Prep: Betadine Patient monitoring: heart rate, cardiac monitor, continuous pulse ox and blood pressure Approach: midline Location: L3-4 Injection technique: single-shot Needle Needle type: Pencil-Tip  Needle gauge: 25 G Needle length: 9 cm Assessment Sensory level: T10 Additional Notes 1510 - Marcaine 12.5mg  and 0.2 mg astromorph intrathecal.

## 2015-02-22 NOTE — Progress Notes (Signed)
Subjective:  Feeling much better now, Neurologically feels normal, A,A & O x 3.   Objective:  Blood pressure 128/87, pulse 115, temperature 97.9 F (36.6 C), temperature source Tympanic, resp. rate 14, height _0  (1.676 m), weight 240 lb (108.863 kg), SpO2 98 %, unknown if currently breastfeeding.  General: NAD Pulmonary: no increased work of breathing, Lungs CTA bilat, no W/R/R.  Heart: S1S2, RRR, no M/R/G. EKG: NSR reviewed by Dr Leafy Ro. Abdomen: non-distended, non-tender, fundus firm at -2/-3 Incision: Dressing is C/D/I without drainage, On-Q pump intact.  Extremities: no edema, no erythema, no tenderness  Results for orders placed or performed during the hospital encounter of 02/22/15 (from the past 72 hour(s))  CBC     Status: Abnormal   Collection Time: 02/22/15  7:12 AM  Result Value Ref Range   WBC 11.5 (H) 3.6 - 11.0 K/uL   RBC 4.51 3.80 - 5.20 MIL/uL   Hemoglobin 13.8 12.0 - 16.0 g/dL   HCT 39.5 35.0 - 47.0 %   MCV 87.7 80.0 - 100.0 fL   MCH 30.6 26.0 - 34.0 pg   MCHC 34.9 32.0 - 36.0 g/dL   RDW 13.8 11.5 - 14.5 %   Platelets 239 150 - 440 K/uL  Uric acid     Status: None   Collection Time: 02/22/15  7:12 AM  Result Value Ref Range   Uric Acid, Serum 5.9 2.3 - 6.6 mg/dL  Comprehensive metabolic panel     Status: Abnormal   Collection Time: 02/22/15  7:12 AM  Result Value Ref Range   Sodium 136 135 - 145 mmol/L   Potassium 4.0 3.5 - 5.1 mmol/L   Chloride 108 101 - 111 mmol/L   CO2 20 (L) 22 - 32 mmol/L   Glucose, Bld 91 65 - 99 mg/dL   BUN 8 6 - 20 mg/dL   Creatinine, Ser 0.50 0.44 - 1.00 mg/dL   Calcium 9.5 8.9 - 10.3 mg/dL   Total Protein 6.6 6.5 - 8.1 g/dL   Albumin 2.9 (L) 3.5 - 5.0 g/dL   AST 48 (H) 15 - 41 U/L   ALT 21 14 - 54 U/L   Alkaline Phosphatase 92 38 - 126 U/L   Total Bilirubin 0.6 0.3 - 1.2 mg/dL   GFR calc non Af Amer >60 >60 mL/min   GFR calc Af Amer >60 >60 mL/min    Comment: (NOTE) The eGFR has been calculated using the CKD EPI  equation. This calculation has not been validated in all clinical situations. eGFR's persistently <60 mL/min signify possible Chronic Kidney Disease.    Anion gap 8 5 - 15  Protein / creatinine ratio, urine     Status: Abnormal   Collection Time: 02/22/15  7:23 AM  Result Value Ref Range   Creatinine, Urine 154 mg/dL   Total Protein, Urine 180 mg/dL    Comment: RESULT CONFIRMED BY MANUAL DILUTION NO NORMAL RANGE ESTABLISHED FOR THIS TEST    Protein Creatinine Ratio 1.17 (H) 0.00 - 0.15 mg/mg[Cre]  AST     Status: Abnormal   Collection Time: 02/22/15 11:04 AM  Result Value Ref Range   AST 90 (H) 15 - 41 U/L  ALT     Status: None   Collection Time: 02/22/15 11:04 AM  Result Value Ref Range   ALT 35 14 - 54 U/L  Platelet count     Status: None   Collection Time: 02/22/15 11:04 AM  Result Value Ref Range   Platelets 222 150 -  440 K/uL  Type and screen     Status: None   Collection Time: 02/22/15 11:06 AM  Result Value Ref Range   ABO/RH(D) O POS    Antibody Screen NEG    Sample Expiration 02/25/2015   ABO/Rh     Status: None   Collection Time: 02/22/15 11:07 AM  Result Value Ref Range   ABO/RH(D) O POS   CBC     Status: Abnormal   Collection Time: 02/22/15  3:56 PM  Result Value Ref Range   WBC 14.8 (H) 3.6 - 11.0 K/uL   RBC 4.36 3.80 - 5.20 MIL/uL   Hemoglobin 13.1 12.0 - 16.0 g/dL   HCT 38.4 35.0 - 47.0 %   MCV 88.2 80.0 - 100.0 fL   MCH 30.1 26.0 - 34.0 pg   MCHC 34.1 32.0 - 36.0 g/dL   RDW 13.8 11.5 - 14.5 %   Platelets 198 150 - 440 K/uL  Magnesium     Status: Abnormal   Collection Time: 02/22/15  3:56 PM  Result Value Ref Range   Magnesium 3.8 (H) 1.7 - 2.4 mg/dL  Comprehensive metabolic panel     Status: Abnormal   Collection Time: 02/22/15  3:56 PM  Result Value Ref Range   Sodium 136 135 - 145 mmol/L   Potassium 3.4 (L) 3.5 - 5.1 mmol/L   Chloride 106 101 - 111 mmol/L   CO2 19 (L) 22 - 32 mmol/L   Glucose, Bld 107 (H) 65 - 99 mg/dL   BUN 8 6 - 20  mg/dL   Creatinine, Ser 0.61 0.44 - 1.00 mg/dL   Calcium 8.7 (L) 8.9 - 10.3 mg/dL   Total Protein 5.8 (L) 6.5 - 8.1 g/dL   Albumin 2.6 (L) 3.5 - 5.0 g/dL   AST 142 (H) 15 - 41 U/L   ALT 61 (H) 14 - 54 U/L   Alkaline Phosphatase 99 38 - 126 U/L   Total Bilirubin 1.2 0.3 - 1.2 mg/dL   GFR calc non Af Amer >60 >60 mL/min   GFR calc Af Amer >60 >60 mL/min    Comment: (NOTE) The eGFR has been calculated using the CKD EPI equation. This calculation has not been validated in all clinical situations. eGFR's persistently <60 mL/min signify possible Chronic Kidney Disease.    Anion gap 11 5 - 15   AT 1556: AST 142 & ALT 198 (Tripled from this am) PLT's: 198,00 from am 270,000  Intake: 1835 IV since Admit Out: 630 out Urine output: 300 mls in OR emptied Total in OR: 30 mls Urine output last 3 hours: 110 mls  VS reviewed since 1637: BP 140/91 P 95 99% SaO2 1652: BP 131/106 P 104 100% SaO2 1707141/100 P104 1737 BP 151/106 P108 1752 150/97 P113 1807 128/87 P113 BP now coming down 128/87  Reflexes are 1+/0 Bilat now SCD's in use  Assessment:   29 y.o. [id number] postoperativeday (Del day) now normalizing BP's, urine output is stable, No HA, blurred vision or RUQ pain.   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 Recheck q 6 hours.  4) BP management: Labetalol 20 mg po tid and Procardia XL 30 mg was stopped.  5) Nausea: Give Zofran 4 mg IV q 4 hours prn nausea  6) MagSo4: 3 gm/hr to maintain a  therapeutic Mag level.  7) Post-op pain: administer pain meds as ordered

## 2015-02-22 NOTE — Progress Notes (Signed)
Subjective:   Feeling much better now, nausea and pain are on control.   Objective:  Blood pressure 125/81, pulse 90, temperature 97.9 F (36.6 C), temperature source Tympanic, resp. rate 20, height 5' 6"  (1.676 m), weight 240 lb (108.863 kg), SpO2 100 %, unknown if currently breastfeeding.  General: NAD, A,A,& O x3. No HA, blurred vision, RUQ pain.  Pulmonary: no increased work of breathing, Lungs CTA bilat, No W/R/R. Heart:S1S2, RRR, No M/R/G.  Abdomen: non-distended, non-tender, fundus firm at level of umbilicus Incision: Extremities: no edema, no erythema, no tenderness  Results for orders placed or performed during the hospital encounter of 02/22/15 (from the past 72 hour(s))  CBC     Status: Abnormal   Collection Time: 02/22/15  7:12 AM  Result Value Ref Range   WBC 11.5 (H) 3.6 - 11.0 K/uL   RBC 4.51 3.80 - 5.20 MIL/uL   Hemoglobin 13.8 12.0 - 16.0 g/dL   HCT 39.5 35.0 - 47.0 %   MCV 87.7 80.0 - 100.0 fL   MCH 30.6 26.0 - 34.0 pg   MCHC 34.9 32.0 - 36.0 g/dL   RDW 13.8 11.5 - 14.5 %   Platelets 239 150 - 440 K/uL  Uric acid     Status: None   Collection Time: 02/22/15  7:12 AM  Result Value Ref Range   Uric Acid, Serum 5.9 2.3 - 6.6 mg/dL  Comprehensive metabolic panel     Status: Abnormal   Collection Time: 02/22/15  7:12 AM  Result Value Ref Range   Sodium 136 135 - 145 mmol/L   Potassium 4.0 3.5 - 5.1 mmol/L   Chloride 108 101 - 111 mmol/L   CO2 20 (L) 22 - 32 mmol/L   Glucose, Bld 91 65 - 99 mg/dL   BUN 8 6 - 20 mg/dL   Creatinine, Ser 0.50 0.44 - 1.00 mg/dL   Calcium 9.5 8.9 - 10.3 mg/dL   Total Protein 6.6 6.5 - 8.1 g/dL   Albumin 2.9 (L) 3.5 - 5.0 g/dL   AST 48 (H) 15 - 41 U/L   ALT 21 14 - 54 U/L   Alkaline Phosphatase 92 38 - 126 U/L   Total Bilirubin 0.6 0.3 - 1.2 mg/dL   GFR calc non Af Amer >60 >60 mL/min   GFR calc Af Amer >60 >60 mL/min    Comment: (NOTE) The eGFR has been calculated using the CKD EPI equation. This calculation has not been  validated in all clinical situations. eGFR's persistently <60 mL/min signify possible Chronic Kidney Disease.    Anion gap 8 5 - 15  Protein / creatinine ratio, urine     Status: Abnormal   Collection Time: 02/22/15  7:23 AM  Result Value Ref Range   Creatinine, Urine 154 mg/dL   Total Protein, Urine 180 mg/dL    Comment: RESULT CONFIRMED BY MANUAL DILUTION NO NORMAL RANGE ESTABLISHED FOR THIS TEST    Protein Creatinine Ratio 1.17 (H) 0.00 - 0.15 mg/mg[Cre]  AST     Status: Abnormal   Collection Time: 02/22/15 11:04 AM  Result Value Ref Range   AST 90 (H) 15 - 41 U/L  ALT     Status: None   Collection Time: 02/22/15 11:04 AM  Result Value Ref Range   ALT 35 14 - 54 U/L  Platelet count     Status: None   Collection Time: 02/22/15 11:04 AM  Result Value Ref Range   Platelets 222 150 - 440 K/uL  Type  and screen     Status: None   Collection Time: 02/22/15 11:06 AM  Result Value Ref Range   ABO/RH(D) O POS    Antibody Screen NEG    Sample Expiration 02/25/2015   ABO/Rh     Status: None   Collection Time: 02/22/15 11:07 AM  Result Value Ref Range   ABO/RH(D) O POS   CBC     Status: Abnormal   Collection Time: 02/22/15  3:56 PM  Result Value Ref Range   WBC 14.8 (H) 3.6 - 11.0 K/uL   RBC 4.36 3.80 - 5.20 MIL/uL   Hemoglobin 13.1 12.0 - 16.0 g/dL   HCT 38.4 35.0 - 47.0 %   MCV 88.2 80.0 - 100.0 fL   MCH 30.1 26.0 - 34.0 pg   MCHC 34.1 32.0 - 36.0 g/dL   RDW 13.8 11.5 - 14.5 %   Platelets 198 150 - 440 K/uL  Magnesium     Status: Abnormal   Collection Time: 02/22/15  3:56 PM  Result Value Ref Range   Magnesium 3.8 (H) 1.7 - 2.4 mg/dL  Comprehensive metabolic panel     Status: Abnormal   Collection Time: 02/22/15  3:56 PM  Result Value Ref Range   Sodium 136 135 - 145 mmol/L   Potassium 3.4 (L) 3.5 - 5.1 mmol/L   Chloride 106 101 - 111 mmol/L   CO2 19 (L) 22 - 32 mmol/L   Glucose, Bld 107 (H) 65 - 99 mg/dL   BUN 8 6 - 20 mg/dL   Creatinine, Ser 0.61 0.44 - 1.00  mg/dL   Calcium 8.7 (L) 8.9 - 10.3 mg/dL   Total Protein 5.8 (L) 6.5 - 8.1 g/dL   Albumin 2.6 (L) 3.5 - 5.0 g/dL   AST 142 (H) 15 - 41 U/L   ALT 61 (H) 14 - 54 U/L   Alkaline Phosphatase 99 38 - 126 U/L   Total Bilirubin 1.2 0.3 - 1.2 mg/dL   GFR calc non Af Amer >60 >60 mL/min   GFR calc Af Amer >60 >60 mL/min    Comment: (NOTE) The eGFR has been calculated using the CKD EPI equation. This calculation has not been validated in all clinical situations. eGFR's persistently <60 mL/min signify possible Chronic Kidney Disease.    Anion gap 11 5 - 15   Urine output: 115 last 3 hours. Reflexes 3+/Brisk bilat, O clonus.   Assessment:   29 y.o. [id number] postoperativeday #o.   Plan:  1) Acute blood loss anemia - hemodynamically stable and asymptomatic - po ferrous sulfate  2)Labs due at 2200.   3) Continue to monitor BP and urine output.    4) Pt is stabilizing and normalizing.   5) Watch for bleeding: Dressing is D/I/and ON-Q intact.

## 2015-02-22 NOTE — Progress Notes (Signed)
Patient ID: Autumn [name], female   DOB: 05/29/1986, 29 y.o.   MRN: [id number]  29yo [id number] at 33+5wks with a history of 3 prior preterm deliveries for hypertension in pregnancy presenting to triage with elevated BP at home of >170 systolic. BP on admission 164/114.   BP highest 176/110 today. Fetal status is reassuring. Cat I strip with adequate fluid.  Labs on admission stable. Repeat labs in 4 hours trending in the wrong direction. Liver enzymes elevated 2x normal, protein elevated in urine, platelets beginning to trend down (239->222).  P:C ratio:  8/17- 0.14 9/14- 1.17 - today  Last ultrasound on 02/11/16:  Vertex Fhr=155 bpm ant plac Afi=175 mm@65 % Efw=1912 g  48%  She has received 2 doses hydralazine, 1 dose of labetalol with good response to the labetolol. S/p mag 4g, now on 2g/hr infusion. S/p 1 course steroids for fetal benefit 5 weeks ago.  Urine output> 150ml/hr.  ROS: +HA, no LOF, good fetal movement, no vaginal bleeding. No visual changes. No new swelling.  PE: CV: RRR Pulm: CTAB in 6 lung fields, bases clear Abd: soft, obese Reflexes: 2+, increased since admission from 1+  Extremities: no edema, no clonus  A: PreE with severe features, desires sterility. P:  Concern for progressing severe PreE with increasing liver enzymes over several hours, severe range BP. Little benefit from prolonging pregnancy 2 days as is already s/p BMZ. Will proceed with repeat LTCS with BTL at patient's request. Risks and benefits of procedure discussed with patient. OR, NICU and anesthesia aware.

## 2015-02-22 NOTE — Progress Notes (Signed)
Patient ID: Autumn [name], female   DOB: [birth year], 29 y.o.   MRN: [id number]  Labwork stabalizing. Low sodium, but LFTs stable. Continue hydration with normal saline. BP reassuringly normal.

## 2015-02-22 NOTE — Progress Notes (Signed)
Patient ID: Autumn [name], female   DOB: Oct 10, 1985, 29 y.o.   MRN: [id number]  Labs drawn at 1600 returned with signs of worsening PreE.  Liver enzymes continuing to rise, now 3x, platelets to fall but still at 200. Kidney function unaffected. No evidence of hemolysis or coagulopathy.  Mag level sub-therapeutic. EKG wnl.  Baby doing well in special care nursery.  Plan:  Increase mag to 3g/hr Continue serial labs q6 hrs Monitor hourly urine output, lung exam, reflexes and mental status. Continue to monitor continuous vitals. Start po labetolol  TID, with iv PRN severe range within 15 minutes. Protocol and verbal instructions given to nursing staff. Stop Procardia while on mag Plan for lisinopril for discharge, as was on pre-pregnancy.

## 2015-02-22 NOTE — Progress Notes (Signed)
S: very anxious regarding the Pre-ecclampsia. Pt overly anxious.No  CTX, no LOF, No VB or decreased FM.  O: Filed Vitals:   02/22/15 1251 02/22/15 1259 02/22/15 1300 02/22/15 1329  BP: 165/101 162/95  165/96  Pulse: 76 86  80  Temp:   98.4 F (36.9 C)   TempSrc:   Oral   Resp:   20   Height:      Weight:       Gen: NAD, AAOx3      Abd: Gravid     Ext: Non-tender, Nonedmeatous, Refelxes 1+/0    FHT: 130 mod var + accelerations no decelerations TOCO: No UC's BP ranges: 165/90-105  A/P:  29 y.o. yo [id number] at [redacted]w[redacted]d for pre-ecclampsia with severe features.  Labor:None at present.   FWB: Reassuring Cat 1 tracing. EFW 5#  GBS: Unknown  PLT's >200,000  Uric acid 2.9  Prot/cratinine High >1 gm  AST & ALT minimally elevated  No neuro findings for pre-ecclampsia  Anxiety: Will treat w/Xanax as pt is having extreme anxiety if not close to panic,  Irreg R-R: Will get baseline EKC  Irregular FHT arrthymia: continue to observe as most convert after delivery   Autumn [name] 1:35 PM

## 2015-02-22 NOTE — H&P (Addendum)
Obstetrics Admission History & Physical  Referring Provider:[facility] Primary OBGYN:    Chief Complaint: Woke up at 4am to go to [place] and noted a high BP that she took. 148/105. Called CNM and discussed coming to [facility] for evaluation.  History of Present Illness  29 y.o. [phone] @ [redacted]w[redacted]d (Dating: by 6 week [place]). 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   Ms. Autumn [name] presents for elevated BP's.   Review of Systems: Positive for uterine contractions upon arrival and now rare one seen. .  Otherwise, her 12 point review of systems is negative or as noted in the History of Present Illness. No HA, RUQ pian or visual changes noted.  Patient Active Problem List   Diagnosis Date Noted  . Labor and delivery, indication for care 02/22/2015  . Elevated BP 01/22/2015    PMHx:  Past Medical History  Diagnosis Date  . Anxiety   . Hypertension    PSHx:  Past Surgical History  Procedure Laterality Date  . Cesarean section    . Tonsillectomy     Medications:  Prescriptions prior to admission  Medication Sig Dispense Refill Last Dose  . aspirin 81 MG tablet Take 81 mg by mouth daily.   02/21/2015 at Unknown time  . Prenatal Vit-Fe Fumarate-FA (MULTIVITAMIN-PRENATAL) 27-0.8 MG TABS tablet Take 1 tablet by mouth daily at 12 noon.   02/21/2015 at Unknown time   Allergies: has No Known Allergies. OBHx:  OB History  Gravida Para Term Preterm AB SAB TAB Ectopic Multiple Living  4 3 0 3      3    # Outcome Date GA Lbr Len/2nd Weight Sex Delivery Anes PTL Lv  4 Current           3 Preterm 06/24/11 [redacted]w[redacted]d    CS-LVertical     2 Preterm 02/12/09 [redacted]w[redacted]d    CS-Classical     1 Preterm 11/22/06 [redacted]w[redacted]d    CS-LVertical       Obstetric Comments  HX of severe preE and HELLP   Borderline hypothyroidism   GYNHx:  History of abnormal pap smears: No. History of STIs: No..             FHx: No family history on file. Soc Hx:  Social History   Social History  . Marital Status: Married    Spouse Name: N/A  . Number of Children: N/A  . Years of Education: N/A   Occupational History  . Not on file.   Social History Main Topics  . Smoking status: Never Smoker   . Smokeless tobacco: Never Used  . Alcohol Use: No  . Drug Use: No  . Sexual Activity: Yes    Birth Control/ Protection: Surgical   Other Topics Concern  . Not on file   Social History Narrative   FOB is involved with the pregnancy   Objective   Filed Vitals:   02/22/15 0855  BP: 152/115  Pulse: 80  Temp:   Resp:    Temp:  [98.1 F (36.7 C)] 98.1 F (36.7 C) (09/17 0724) Pulse Rate:  [77-85] 80 (09/17 0855) Resp:  [20] 20 (09/17 0724) BP: (143-164)/(100-115) 152/115 mmHg (09/17 0855) Weight:  [240 lb (108.863 kg)] 240 lb (108.863 kg) (09/17 0734) Temp (24hrs), Avg:98.1 F (36.7 C), Min:98.1 F (  36.7 C), Max:98.1 F (36.7 C)  No intake or output data in the 24 hours ending 02/22/15 0911    Current Vital Signs 24h Vital Sign Ranges  T 98.1 F (36.7 C) Temp  Avg: 98.1 F (36.7 C)  Min: 98.1 F (36.7 C)  Max: 98.1 F (36.7 C)  BP (!) 152/115 mmHg BP  Min: 143/100  Max: 164/114  HR 80 Pulse  Avg: 80.8  Min: 77  Max: 85  RR 20 Resp  Avg: 20  Min: 20  Max: 20  SaO2     No Data Recorded       24 Hour I/O Current Shift I/O  Time Ins Outs       EFM: Cat I, audible arrthymia heard, + accels, no decels. Mod variability Toco:  Rare UC seen now  General: Well nourished, well developed female in no acute distress.  Skin:  Warm and dry.  Cardiovascular: S1S2, Sl irreg heart rate, No M/R/G. Respiratory:  Clear to auscultation bilateral. Normal respiratory effort. No W/R/R. Abdomen: Gravid, NT Neuro/Psych:  Normal mood and affect.    SVE: not evaluated Leopolds/EFW:  5#0  Labs   Ultrasounds Normal anatomy scan at 20 weeks, 01/09/15 AFI of 16, EFW 2#6  Perinatal info   Rubella / Immune, Varicella immune,  O pos, AB neg, Pap neg, HIV neg, HepB neg, RPR NR,   Assessment & Plan   29 y.o. [id number] @ [redacted]w[redacted]d with signs and symptoms, with likely  IUP at 33 5/7 weeks.  GBs unknown Sharee Pimple, MSN, CNM, FNP [facility]    ~~~~~~~~~~~~~~~~~~~~~~~~~~~ Patient admitted by CNM, with H&P completed. I was supervising physician for this admission. I agree with the above documentation.   Cline Cools, MD, MPH

## 2015-02-22 NOTE — Progress Notes (Signed)
While in OR after scrubbing, FHR was heard by CNM and 92 on Doppler and maternal rate was 41 at the time so audible FHR was the baby. C-sectionj was advanced to STAT with prep done by CNM, Dr Dalbert Garnet called in and pt prepped and draped ASAP.

## 2015-02-22 NOTE — OR Nursing (Signed)
ON-Q PUMP placed in abdominal folds and dosed with 78ml's of Sensorcaine

## 2015-02-22 NOTE — Progress Notes (Signed)
Case reviewed with Dr Dalbert Garnet at 10 am and advised to start meds for BP control as well as MagSo4 until pt is 34 weeks at which time pre-ecclampsia with severe features may require a classical C-section. Pt is not laboring. Pt will be seen by Dr Dalbert Garnet and care directed by her. Pt aware of the impending situation and will be admitted, given BMZ x 2 doses, ABX if becomes evident that she will be delivering due to GBS unknown. BP will be managed with Procardia 30 mg XL, Hydralazine to keep the BP normal, MagSo4 to render tx for pre-ecclampsia. Pt infomed of the care that will be necessary and advised her to understand the severe features of pre-ecclampsia

## 2015-02-22 NOTE — Transfer of Care (Signed)
Immediate Anesthesia Transfer of Care Note  Patient: Autumn [name]  Procedure(s) Performed: Procedure(s): CESAREAN SECTION BILATERAL TUBAL LIGATION (Bilateral)  Patient Location: PACU  Anesthesia Type:Spinal  Level of Consciousness: awake, alert  and oriented  Airway & Oxygen Therapy: Patient Spontanous Breathing  Post-op Assessment: Report given to RN and Post -op Vital signs reviewed and stable  Post vital signs: Reviewed and stable  Last Vitals:  Filed Vitals:   02/22/15 1633  BP: 139/86  Pulse: 94  Temp: 36.6 C  Resp: 14    Complications: No apparent anesthesia complications

## 2015-02-22 NOTE — Plan of Care (Signed)
Problem: Consults Goal: Birthing Suites Patient Information Press F2 to bring up selections list Outcome: Completed/Met Date Met:  02/22/15  Pt < [redacted] weeks EGA and PIH (Pregnancy induced hypertension) Goal: Chaplain Consult Outcome: Completed/Met Date Met:  02/22/15 Personal clergy visited Goal: NICU Tour Outcome: Not Met (add Reason) Experienced time in NICU with last delivery  Problem: Phase II Progression Outcomes Goal: Skin to skin contact initiated Outcome: Not Met (add Reason) Infant to NICU Goal: Initiate breastfeeding within 1hr delivery Outcome: Not Met (add Reason) Infant in NICU

## 2015-02-22 NOTE — Progress Notes (Addendum)
Subjective:   No complaints of HA, blurred vision or RUQ pain. Resting.   Objective:  Blood pressure 123/80, pulse 86, temperature 97.8 F (36.6 C), temperature source Oral, resp. rate 20, height _0  (1.676 m), weight 240 lb (108.863 kg), SpO2 99 %, unknown if currently breastfeeding.  General: NAD Pulmonary: no increased work of breathing, Lungs CTA bilat, No W/R/R. Heart: S1S2, RRR, no M/R/G.Abdomen: non-distended, non-tender, fundus firm at level of umbilicus Extremities: no edema, no erythema, no tenderness Reflexes:  Brisk/  clonus  Results for orders placed or performed during the hospital encounter of 02/22/15 (from the past 72 hour(s))  CBC     Status: Abnormal   Collection Time: 02/22/15  7:12 AM  Result Value Ref Range   WBC 11.5 (H) 3.6 - 11.0 K/uL   RBC 4.51 3.80 - 5.20 MIL/uL   Hemoglobin 13.8 12.0 - 16.0 g/dL   HCT 39.5 35.0 - 47.0 %   MCV 87.7 80.0 - 100.0 fL   MCH 30.6 26.0 - 34.0 pg   MCHC 34.9 32.0 - 36.0 g/dL   RDW 13.8 11.5 - 14.5 %   Platelets 239 150 - 440 K/uL  Uric acid     Status: None   Collection Time: 02/22/15  7:12 AM  Result Value Ref Range   Uric Acid, Serum 5.9 2.3 - 6.6 mg/dL  Comprehensive metabolic panel     Status: Abnormal   Collection Time: 02/22/15  7:12 AM  Result Value Ref Range   Sodium 136 135 - 145 mmol/L   Potassium 4.0 3.5 - 5.1 mmol/L   Chloride 108 101 - 111 mmol/L   CO2 20 (L) 22 - 32 mmol/L   Glucose, Bld 91 65 - 99 mg/dL   BUN 8 6 - 20 mg/dL   Creatinine, Ser 0.50 0.44 - 1.00 mg/dL   Calcium 9.5 8.9 - 10.3 mg/dL   Total Protein 6.6 6.5 - 8.1 g/dL   Albumin 2.9 (L) 3.5 - 5.0 g/dL   AST 48 (H) 15 - 41 U/L   ALT 21 14 - 54 U/L   Alkaline Phosphatase 92 38 - 126 U/L   Total Bilirubin 0.6 0.3 - 1.2 mg/dL   GFR calc non Af Amer >60 >60 mL/min   GFR calc Af Amer >60 >60 mL/min    Comment: (NOTE) The eGFR has been calculated using the CKD EPI equation. This calculation has not been validated in all clinical  situations. eGFR's persistently <60 mL/min signify possible Chronic Kidney Disease.    Anion gap 8 5 - 15  Protein / creatinine ratio, urine     Status: Abnormal   Collection Time: 02/22/15  7:23 AM  Result Value Ref Range   Creatinine, Urine 154 mg/dL   Total Protein, Urine 180 mg/dL    Comment: RESULT CONFIRMED BY MANUAL DILUTION NO NORMAL RANGE ESTABLISHED FOR THIS TEST    Protein Creatinine Ratio 1.17 (H) 0.00 - 0.15 mg/mg[Cre]  AST     Status: Abnormal   Collection Time: 02/22/15 11:04 AM  Result Value Ref Range   AST 90 (H) 15 - 41 U/L  ALT     Status: None   Collection Time: 02/22/15 11:04 AM  Result Value Ref Range   ALT 35 14 - 54 U/L  Platelet count     Status: None   Collection Time: 02/22/15 11:04 AM  Result Value Ref Range   Platelets 222 150 - 440 K/uL  Type and screen     Status:  None   Collection Time: 02/22/15 11:06 AM  Result Value Ref Range   ABO/RH(D) O POS    Antibody Screen NEG    Sample Expiration 02/25/2015   ABO/Rh     Status: None   Collection Time: 02/22/15 11:07 AM  Result Value Ref Range   ABO/RH(D) O POS   CBC     Status: Abnormal   Collection Time: 02/22/15  3:56 PM  Result Value Ref Range   WBC 14.8 (H) 3.6 - 11.0 K/uL   RBC 4.36 3.80 - 5.20 MIL/uL   Hemoglobin 13.1 12.0 - 16.0 g/dL   HCT 38.4 35.0 - 47.0 %   MCV 88.2 80.0 - 100.0 fL   MCH 30.1 26.0 - 34.0 pg   MCHC 34.1 32.0 - 36.0 g/dL   RDW 13.8 11.5 - 14.5 %   Platelets 198 150 - 440 K/uL  Magnesium     Status: Abnormal   Collection Time: 02/22/15  3:56 PM  Result Value Ref Range   Magnesium 3.8 (H) 1.7 - 2.4 mg/dL  Comprehensive metabolic panel     Status: Abnormal   Collection Time: 02/22/15  3:56 PM  Result Value Ref Range   Sodium 136 135 - 145 mmol/L   Potassium 3.4 (L) 3.5 - 5.1 mmol/L   Chloride 106 101 - 111 mmol/L   CO2 19 (L) 22 - 32 mmol/L   Glucose, Bld 107 (H) 65 - 99 mg/dL   BUN 8 6 - 20 mg/dL   Creatinine, Ser 0.61 0.44 - 1.00 mg/dL   Calcium 8.7 (L) 8.9  - 10.3 mg/dL   Total Protein 5.8 (L) 6.5 - 8.1 g/dL   Albumin 2.6 (L) 3.5 - 5.0 g/dL   AST 142 (H) 15 - 41 U/L   ALT 61 (H) 14 - 54 U/L   Alkaline Phosphatase 99 38 - 126 U/L   Total Bilirubin 1.2 0.3 - 1.2 mg/dL   GFR calc non Af Amer >60 >60 mL/min   GFR calc Af Amer >60 >60 mL/min    Comment: (NOTE) The eGFR has been calculated using the CKD EPI equation. This calculation has not been validated in all clinical situations. eGFR's persistently <60 mL/min signify possible Chronic Kidney Disease.    Anion gap 11 5 - 15  Comprehensive metabolic panel     Status: Abnormal   Collection Time: 02/22/15 10:24 PM  Result Value Ref Range   Sodium 130 (L) 135 - 145 mmol/L   Potassium 4.3 3.5 - 5.1 mmol/L   Chloride 100 (L) 101 - 111 mmol/L   CO2 21 (L) 22 - 32 mmol/L   Glucose, Bld 140 (H) 65 - 99 mg/dL   BUN 8 6 - 20 mg/dL   Creatinine, Ser 0.59 0.44 - 1.00 mg/dL   Calcium 7.3 (L) 8.9 - 10.3 mg/dL   Total Protein 5.8 (L) 6.5 - 8.1 g/dL   Albumin 2.6 (L) 3.5 - 5.0 g/dL   AST 132 (H) 15 - 41 U/L   ALT 66 (H) 14 - 54 U/L   Alkaline Phosphatase 98 38 - 126 U/L   Total Bilirubin 1.6 (H) 0.3 - 1.2 mg/dL   GFR calc non Af Amer >60 >60 mL/min   GFR calc Af Amer >60 >60 mL/min    Comment: (NOTE) The eGFR has been calculated using the CKD EPI equation. This calculation has not been validated in all clinical situations. eGFR's persistently <60 mL/min signify possible Chronic Kidney Disease.    Anion gap 9  5 - 15  Uric acid     Status: None   Collection Time: 02/22/15 10:24 PM  Result Value Ref Range   Uric Acid, Serum 6.0 2.3 - 6.6 mg/dL  Magnesium     Status: Abnormal   Collection Time: 02/22/15 10:24 PM  Result Value Ref Range   Magnesium 5.5 (H) 1.7 - 2.4 mg/dL  Lactate dehydrogenase     Status: Abnormal   Collection Time: 02/22/15 10:24 PM  Result Value Ref Range   LDH 298 (H) 98 - 192 U/L  PLTS: 163,000 BP ranges: 123/80-124/74. Urine output: +140 (1900-0700 thus  far)   Assessment:   29 y.o. [id number] postoperativeday  0, with pre-ecclampsia with severe features. BP normalizing, labs improving, Will re-eval in am with labs.    Plan:  1) Acute blood loss anemia - hemodynamically stable and asymptomatic - po ferrous sulfate  2) --/--/O POS (09/17 1107) / Immune (08/07 0000) / Varicella immune, Rubella unknown: IgG ordered  3) TDAP status: UTD   4) Continue MagSo4 at 3 gms/hr  5) Will change IV to 0.9 NS to correct hyponatremia.   6) Re-evaluate labs in am as ordered.  7) Mag level is 5.3

## 2015-02-22 NOTE — Op Note (Addendum)
Cesarean Section Procedure Note  Indications: previous uterine incision classical kerr x2 and severe preeclampsia  Pre-operative Diagnosis:  1. Intrauterine pregnancy at [redacted]w[redacted]d;  2. Desires permanent sterilization 3. GBS unknown 4. Elevated liver enzymes  Post-operative Diagnosis: same, delivered.  Procedure: 1. Low Transverse Cesarean Section through Pfannenstiel incision  2. Bilateral tubal sterilization using modified Parkland method  Surgeon: Christeen Douglas, MD  Assistant(s):  Milon Score, CNM  Anesthesia: Spinal anesthesia  Estimated Blood Loss:          Drains: none         Total IV Fluids:  Urine Output: 30ml         Specimens: portion of right and left tube         Complications:  None; patient tolerated the procedure well.         Disposition: PACU - hemodynamically stable.         Condition: stable  Findings:  A female infant in cephalic presentation. Very thin lower uterine segment Amniotic fluid - Clear  Birth weight 2200 g.  Apgars of 8 and 8.  Intact placenta with a three-vessel cord.  Grossly normal uterus, tubes and ovaries bilaterally. Dense intraabdominal adhesions were noted. Baby cord blood gas: 7.22 on likely venous blood.  Time from incision to delivery: 1 minute   Procedure Details  The patient was taken to Operating Room, identified as the correct patient and the procedure verified as C-Section Delivery. A Time Out was held and the above information confirmed.  After induction of anesthesia, the fetal heart tones were auscultated with a handheld Doppler and noted to be in the 90s. The maternal heart rate was in the 40s, and anesthesia was addressing this abnormality. For this reason, the urgency of the surgery increased. The patient was draped and prepped in the usual sterile manner, however, while maternal stabilization was affected.   A Pfannenstiel incision was made and carried down through the subcutaneous tissue to the  fascia. Fascial incision was made and extended transversely bluntly. The fascia was separated from the underlying rectus tissue superiorly and inferiorly. The peritoneum was identified and entered bluntly. Peritoneal incision was extended longitudinally. The utero-vesical peritoneal reflection was incised transversely and a bladder flap was created digitally. Dense adhesions were taken down carefully sharply as they were encountered.  A low transverse hysterotomy through a very thin lower segment was made. The fetus was delivered atraumatically. The umbilical cord was clamped x2 and cut and the infant was handed to the awaiting pediatricians. The placenta was removed intact and appeared normal with a 3-vessel cord. Cord gas was attempted to be collected by the circulating nurse but only venous blood was collected.  The uterus was exteriorized and cleared of all clot and debris. The hysterotomy was closed with running sutures of  0-Vicryl.  Excellent hemostasis was observed.   Attention was then turned to the tubal ligation. The left fallopian tube distinguished from the round ligament by identifying the fimbria and was grasped with a Babcock clamp in the midisthmic portion approximately 3 cm from the cornual region. It was then doubly ligated with 0-plain gut suture in a Parkland fashion. The tubal segment was excised with Metzenbaum scissors. Tubal ostea noted. The procedure was repeated on the right side. *Care was noted to examine both tubal sites in situ to ensure the sutures were intact and no bleeding was noted. The uterus was returned to the abdomen.  The pelvis was cleared of clots and debris and  again, excellent hemostasis was noted. The fascia was then reapproximated with running sutures of 0 Vicryl.  The subcutaneous tissue was reapproximated with running sutures of 0 Vicryl. The skin was reapproximated with a 4-0 Monocryl subcuticular stitch.  Instrument, sponge, and needle counts were correct  prior to the abdominal closure and at the conclusion of the case.   The patient tolerated the procedure well and was transferred to the recovery room in stable condition.   Christeen Douglas, MD9/17/2016

## 2015-02-23 LAB — COMPREHENSIVE METABOLIC PANEL
ALBUMIN: 2.4 g/dL — AB (ref 3.5–5.0)
ALBUMIN: 2.4 g/dL — AB (ref 3.5–5.0)
ALK PHOS: 93 U/L (ref 38–126)
ALK PHOS: 83 U/L (ref 38–126)
ALK PHOS: 89 U/L (ref 38–126)
ALT: 49 U/L (ref 14–54)
ALT: 42 U/L (ref 14–54)
ALT: 36 U/L (ref 14–54)
ANION GAP: 8 (ref 5–15)
AST: 71 U/L — ABNORMAL HIGH (ref 15–41)
AST: 53 U/L — AB (ref 15–41)
AST: 38 U/L (ref 15–41)
Albumin: 2.3 g/dL — ABNORMAL LOW (ref 3.5–5.0)
Anion gap: 8 (ref 5–15)
Anion gap: 7 (ref 5–15)
BILIRUBIN TOTAL: 0.8 mg/dL (ref 0.3–1.2)
BILIRUBIN TOTAL: 1 mg/dL (ref 0.3–1.2)
BUN: 9 mg/dL (ref 6–20)
BUN: 8 mg/dL (ref 6–20)
BUN: 7 mg/dL (ref 6–20)
CALCIUM: 6.6 mg/dL — AB (ref 8.9–10.3)
CALCIUM: 5.8 mg/dL — AB (ref 8.9–10.3)
CALCIUM: 6.5 mg/dL — AB (ref 8.9–10.3)
CHLORIDE: 97 mmol/L — AB (ref 101–111)
CO2: 22 mmol/L (ref 22–32)
CO2: 22 mmol/L (ref 22–32)
CO2: 23 mmol/L (ref 22–32)
CREATININE: 0.46 mg/dL (ref 0.44–1.00)
CREATININE: 0.5 mg/dL (ref 0.44–1.00)
CREATININE: 0.55 mg/dL (ref 0.44–1.00)
Chloride: 98 mmol/L — ABNORMAL LOW (ref 101–111)
Chloride: 92 mmol/L — ABNORMAL LOW (ref 101–111)
GFR calc Af Amer: >60 mL/min (ref >60)
GFR calc Af Amer: >60 mL/min (ref >60)
GFR calc non Af Amer: >60 mL/min (ref >60)
GFR calc non Af Amer: >60 mL/min (ref >60)
GLUCOSE: 110 mg/dL — AB (ref 65–99)
GLUCOSE: 138 mg/dL — AB (ref 65–99)
Glucose, Bld: 97 mg/dL (ref 65–99)
Potassium: 4 mmol/L (ref 3.5–5.1)
Potassium: 3.5 mmol/L (ref 3.5–5.1)
Potassium: 3.8 mmol/L (ref 3.5–5.1)
SODIUM: 128 mmol/L — AB (ref 135–145)
SODIUM: 127 mmol/L — AB (ref 135–145)
Sodium: 122 mmol/L — ABNORMAL LOW (ref 135–145)
TOTAL PROTEIN: 5.3 g/dL — AB (ref 6.5–8.1)
TOTAL PROTEIN: 5.3 g/dL — AB (ref 6.5–8.1)
Total Bilirubin: 0.9 mg/dL (ref 0.3–1.2)
Total Protein: 5.3 g/dL — ABNORMAL LOW (ref 6.5–8.1)

## 2015-02-23 LAB — MAGNESIUM
MAGNESIUM: 5.5 mg/dL — AB (ref 1.7–2.4)
Magnesium: 6.1 mg/dL (ref 1.7–2.4)

## 2015-02-23 LAB — PROTEIN / CREATININE RATIO, URINE
Creatinine, Urine: 57 mg/dL
PROTEIN CREATININE RATIO: 0.21 mg/mg{creat} — AB (ref 0.00–0.15)
Total Protein, Urine: 12 mg/dL

## 2015-02-23 LAB — CBC WITH DIFFERENTIAL/PLATELET
BASOS PCT: 1 %
Basophils Absolute: 0.1 10*3/uL (ref 0–0.1)
Eosinophils Absolute: 0.1 10*3/uL (ref 0–0.7)
Eosinophils Relative: 1 %
HEMATOCRIT: 31.8 % — AB (ref 35.0–47.0)
HEMOGLOBIN: 11.2 g/dL — AB (ref 12.0–16.0)
LYMPHS PCT: 15 %
Lymphs Abs: 1.6 10*3/uL (ref 1.0–3.6)
MCH: 30.8 pg (ref 26.0–34.0)
MCHC: 35.1 g/dL (ref 32.0–36.0)
MCV: 87.6 fL (ref 80.0–100.0)
MONO ABS: 0.7 10*3/uL (ref 0.2–0.9)
MONOS PCT: 6 %
NEUTROS ABS: 8.7 10*3/uL — AB (ref 1.4–6.5)
NEUTROS PCT: 77 %
Platelets: 143 10*3/uL — ABNORMAL LOW (ref 150–440)
RBC: 3.63 MIL/uL — ABNORMAL LOW (ref 3.80–5.20)
RDW: 14.1 % (ref 11.5–14.5)
WBC: 11.1 10*3/uL — ABNORMAL HIGH (ref 3.6–11.0)

## 2015-02-23 LAB — LACTATE DEHYDROGENASE
LDH: 250 U/L — ABNORMAL HIGH (ref 98–192)
LDH: 235 U/L — AB (ref 98–192)
LDH: 217 U/L — ABNORMAL HIGH (ref 98–192)

## 2015-02-23 LAB — RPR: RPR Ser Ql: NONREACTIVE

## 2015-02-23 LAB — PLATELET COUNT
Platelets: 149 10*3/uL — ABNORMAL LOW (ref 150–440)
Platelets: 154 10*3/uL (ref 150–440)

## 2015-02-23 LAB — URIC ACID
URIC ACID, SERUM: 5.9 mg/dL (ref 2.3–6.6)
URIC ACID, SERUM: 5.3 mg/dL (ref 2.3–6.6)
Uric Acid, Serum: 5.3 mg/dL (ref 2.3–6.6)

## 2015-02-23 MED ORDER — SODIUM CHLORIDE 1 G PO TABS
1.0000 g | ORAL_TABLET | Freq: Two times a day (BID) | ORAL | Status: DC
  Administered 2015-02-23 – 2015-02-25 (×4): 1 g via ORAL
  Filled 2015-02-23 (×6): qty 1

## 2015-02-23 MED ORDER — BREAST MILK
ORAL | Status: DC
  Filled 2015-02-23: qty 1

## 2015-02-23 NOTE — Progress Notes (Signed)
Reflexes 2+

## 2015-02-23 NOTE — Progress Notes (Addendum)
Subjective:   C/o "mouth is dry", otherwise, no complaints.   Objective:  Blood pressure 136/80, pulse 80, temperature 97.5 F (36.4 C), temperature source Oral, resp. rate 17, height 5' 6"  (1.676 m), weight 240 lb (108.863 kg), SpO2 97 %, unknown if currently breastfeeding.  General: NAD Pulmonary: no increased work of breathing. Lungs CTA bilat,no W/R/R. Heart: S1S2, RRR, no M/R/G. Abdomen: non-distended, non-tender, fundus firm at level of umbilicus Incision: Dressing D/I and no drainage Extremities: no edema, no erythema, no tenderness Reflexes 2+/0 bilat  Results for orders placed or performed during the hospital encounter of 02/22/15 (from the past 72 hour(s))  CBC     Status: Abnormal   Collection Time: 02/22/15  7:12 AM  Result Value Ref Range   WBC 11.5 (H) 3.6 - 11.0 K/uL   RBC 4.51 3.80 - 5.20 MIL/uL   Hemoglobin 13.8 12.0 - 16.0 g/dL   HCT 39.5 35.0 - 47.0 %   MCV 87.7 80.0 - 100.0 fL   MCH 30.6 26.0 - 34.0 pg   MCHC 34.9 32.0 - 36.0 g/dL   RDW 13.8 11.5 - 14.5 %   Platelets 239 150 - 440 K/uL  Uric acid     Status: None   Collection Time: 02/22/15  7:12 AM  Result Value Ref Range   Uric Acid, Serum 5.9 2.3 - 6.6 mg/dL  Comprehensive metabolic panel     Status: Abnormal   Collection Time: 02/22/15  7:12 AM  Result Value Ref Range   Sodium 136 135 - 145 mmol/L   Potassium 4.0 3.5 - 5.1 mmol/L   Chloride 108 101 - 111 mmol/L   CO2 20 (L) 22 - 32 mmol/L   Glucose, Bld 91 65 - 99 mg/dL   BUN 8 6 - 20 mg/dL   Creatinine, Ser 0.50 0.44 - 1.00 mg/dL   Calcium 9.5 8.9 - 10.3 mg/dL   Total Protein 6.6 6.5 - 8.1 g/dL   Albumin 2.9 (L) 3.5 - 5.0 g/dL   AST 48 (H) 15 - 41 U/L   ALT 21 14 - 54 U/L   Alkaline Phosphatase 92 38 - 126 U/L   Total Bilirubin 0.6 0.3 - 1.2 mg/dL   GFR calc non Af Amer >60 >60 mL/min   GFR calc Af Amer >60 >60 mL/min    Comment: (NOTE) The eGFR has been calculated using the CKD EPI equation. This calculation has not been validated in  all clinical situations. eGFR's persistently <60 mL/min signify possible Chronic Kidney Disease.    Anion gap 8 5 - 15  Protein / creatinine ratio, urine     Status: Abnormal   Collection Time: 02/22/15  7:23 AM  Result Value Ref Range   Creatinine, Urine 154 mg/dL   Total Protein, Urine 180 mg/dL    Comment: RESULT CONFIRMED BY MANUAL DILUTION NO NORMAL RANGE ESTABLISHED FOR THIS TEST    Protein Creatinine Ratio 1.17 (H) 0.00 - 0.15 mg/mg[Cre]  AST     Status: Abnormal   Collection Time: 02/22/15 11:04 AM  Result Value Ref Range   AST 90 (H) 15 - 41 U/L  ALT     Status: None   Collection Time: 02/22/15 11:04 AM  Result Value Ref Range   ALT 35 14 - 54 U/L  Platelet count     Status: None   Collection Time: 02/22/15 11:04 AM  Result Value Ref Range   Platelets 222 150 - 440 K/uL  RPR     Status: None  Collection Time: 02/22/15 11:04 AM  Result Value Ref Range   RPR Ser Ql Non Reactive Non Reactive    Comment: (NOTE) Performed At: [facility] [place], [place] [id number] Lindon Romp MD [id number]   Type and screen     Status: None   Collection Time: 02/22/15 11:06 AM  Result Value Ref Range   ABO/RH(D) O POS    Antibody Screen NEG    Sample Expiration 02/25/2015   ABO/Rh     Status: None   Collection Time: 02/22/15 11:07 AM  Result Value Ref Range   ABO/RH(D) O POS   CBC     Status: Abnormal   Collection Time: 02/22/15  3:56 PM  Result Value Ref Range   WBC 14.8 (H) 3.6 - 11.0 K/uL   RBC 4.36 3.80 - 5.20 MIL/uL   Hemoglobin 13.1 12.0 - 16.0 g/dL   HCT 38.4 35.0 - 47.0 %   MCV 88.2 80.0 - 100.0 fL   MCH 30.1 26.0 - 34.0 pg   MCHC 34.1 32.0 - 36.0 g/dL   RDW 13.8 11.5 - 14.5 %   Platelets 198 150 - 440 K/uL  Magnesium     Status: Abnormal   Collection Time: 02/22/15  3:56 PM  Result Value Ref Range   Magnesium 3.8 (H) 1.7 - 2.4 mg/dL  Comprehensive metabolic panel     Status: Abnormal   Collection Time: 02/22/15  3:56 PM   Result Value Ref Range   Sodium 136 135 - 145 mmol/L   Potassium 3.4 (L) 3.5 - 5.1 mmol/L   Chloride 106 101 - 111 mmol/L   CO2 19 (L) 22 - 32 mmol/L   Glucose, Bld 107 (H) 65 - 99 mg/dL   BUN 8 6 - 20 mg/dL   Creatinine, Ser 0.61 0.44 - 1.00 mg/dL   Calcium 8.7 (L) 8.9 - 10.3 mg/dL   Total Protein 5.8 (L) 6.5 - 8.1 g/dL   Albumin 2.6 (L) 3.5 - 5.0 g/dL   AST 142 (H) 15 - 41 U/L   ALT 61 (H) 14 - 54 U/L   Alkaline Phosphatase 99 38 - 126 U/L   Total Bilirubin 1.2 0.3 - 1.2 mg/dL   GFR calc non Af Amer >60 >60 mL/min   GFR calc Af Amer >60 >60 mL/min    Comment: (NOTE) The eGFR has been calculated using the CKD EPI equation. This calculation has not been validated in all clinical situations. eGFR's persistently <60 mL/min signify possible Chronic Kidney Disease.    Anion gap 11 5 - 15  Comprehensive metabolic panel     Status: Abnormal   Collection Time: 02/22/15 10:24 PM  Result Value Ref Range   Sodium 130 (L) 135 - 145 mmol/L   Potassium 4.3 3.5 - 5.1 mmol/L   Chloride 100 (L) 101 - 111 mmol/L   CO2 21 (L) 22 - 32 mmol/L   Glucose, Bld 140 (H) 65 - 99 mg/dL   BUN 8 6 - 20 mg/dL   Creatinine, Ser 0.59 0.44 - 1.00 mg/dL   Calcium 7.3 (L) 8.9 - 10.3 mg/dL   Total Protein 5.8 (L) 6.5 - 8.1 g/dL   Albumin 2.6 (L) 3.5 - 5.0 g/dL   AST 132 (H) 15 - 41 U/L   ALT 66 (H) 14 - 54 U/L   Alkaline Phosphatase 98 38 - 126 U/L   Total Bilirubin 1.6 (H) 0.3 - 1.2 mg/dL   GFR calc non Af Amer >60 >60  mL/min   GFR calc Af Amer >60 >60 mL/min    Comment: (NOTE) The eGFR has been calculated using the CKD EPI equation. This calculation has not been validated in all clinical situations. eGFR's persistently <60 mL/min signify possible Chronic Kidney Disease.    Anion gap 9 5 - 15  Uric acid     Status: None   Collection Time: 02/22/15 10:24 PM  Result Value Ref Range   Uric Acid, Serum 6.0 2.3 - 6.6 mg/dL  Magnesium     Status: Abnormal   Collection Time: 02/22/15 10:24 PM  Result  Value Ref Range   Magnesium 5.5 (H) 1.7 - 2.4 mg/dL  Lactate dehydrogenase     Status: Abnormal   Collection Time: 02/22/15 10:24 PM  Result Value Ref Range   LDH 298 (H) 98 - 192 U/L  Platelet count     Status: None   Collection Time: 02/22/15 10:29 PM  Result Value Ref Range   Platelets 163 150 - 440 K/uL  Comprehensive metabolic panel     Status: Abnormal   Collection Time: 02/23/15  5:45 AM  Result Value Ref Range   Sodium 128 (L) 135 - 145 mmol/L   Potassium 4.0 3.5 - 5.1 mmol/L   Chloride 98 (L) 101 - 111 mmol/L   CO2 22 22 - 32 mmol/L   Glucose, Bld 110 (H) 65 - 99 mg/dL   BUN 9 6 - 20 mg/dL   Creatinine, Ser 0.46 0.44 - 1.00 mg/dL   Calcium 6.6 (L) 8.9 - 10.3 mg/dL   Total Protein 5.3 (L) 6.5 - 8.1 g/dL   Albumin 2.4 (L) 3.5 - 5.0 g/dL   AST 71 (H) 15 - 41 U/L   ALT 49 14 - 54 U/L   Alkaline Phosphatase 93 38 - 126 U/L   Total Bilirubin 0.8 0.3 - 1.2 mg/dL   GFR calc non Af Amer >60 >60 mL/min   GFR calc Af Amer >60 >60 mL/min    Comment: (NOTE) The eGFR has been calculated using the CKD EPI equation. This calculation has not been validated in all clinical situations. eGFR's persistently <60 mL/min signify possible Chronic Kidney Disease.    Anion gap 8 5 - 15  Uric acid     Status: None   Collection Time: 02/23/15  5:45 AM  Result Value Ref Range   Uric Acid, Serum 5.9 2.3 - 6.6 mg/dL  Magnesium     Status: Abnormal   Collection Time: 02/23/15  5:45 AM  Result Value Ref Range   Magnesium 6.1 (HH) 1.7 - 2.4 mg/dL    Comment: CRITICAL RESULT CALLED TO, READ BACK BY AND VERIFIED WITH [facility] APEL 02/23/15 0702 SJL   Platelet count     Status: Abnormal   Collection Time: 02/23/15  5:45 AM  Result Value Ref Range   Platelets 149 (L) 150 - 440 K/uL  Lactate dehydrogenase     Status: Abnormal   Collection Time: 02/23/15  5:46 AM  Result Value Ref Range   LDH 250 (H) 98 - 192 U/L     Assessment:   29 y.o. [id number] postoperativeday # 1 PLT 128,000 BP:  normalizing Urine output: 100 ml   Plan:  1) Acute blood loss anemia - hemodynamically stable and asymptomatic - po ferrous sulfate  2) Pre-ecclampsia with severe features, HELLP improving this am  3) MagSo4 at 3 gm/h till 1200.   4) Breast/Pumping  5) Plan po fluids and regular diet today  6. Hyponatremic:will  add Na tabs  7. Continue Labetalol 200 mg po tid for BP control.

## 2015-02-23 NOTE — Progress Notes (Signed)
Patient ID: Autumn [name], female   DOB: 10/26/1985, 29 y.o.   MRN: [id number]  0600 labs this morning continuing to improve. LFTs normalizing. Plts still dropping, now at 149.  BP wnl on labetolol po  TID. UOP adequate, 160mL/hr.  Mag level therapeutic.  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

## 2015-02-23 NOTE — Progress Notes (Signed)
All of the 1805 lab Values reported to C. Yetta Barre CNM at this time. Order to D/C all Q6h lab draws and to order a Comp. Metabolic Panel as well at [facility]. And HCT and Plt. Count at 0500 in A.M. Received. Urine P/C ratio D/C'd as Per Beatriz Stallion order as well. Pt. Is to to maintain Saline Lock and no fluids at this time as per Beatriz Stallion' order. Report given by telephone and read back.

## 2015-02-23 NOTE — Plan of Care (Signed)
Problem: Consults Goal: Skin Care Protocol Initiated - if Braden Score 18 or less If consults are not indicated, leave blank or document N/A  Outcome: Not Progressing Infant Admitted to SCN  Problem: Phase I Progression Outcomes Goal: Foley catheter patent Outcome: Completed/Met Date Met:  02/23/15 D/C'd in BP

## 2015-02-24 LAB — COMPREHENSIVE METABOLIC PANEL
ALT: 24 U/L (ref 14–54)
AST: 21 U/L (ref 15–41)
Albumin: 2.3 g/dL — ABNORMAL LOW (ref 3.5–5.0)
Alkaline Phosphatase: 72 U/L (ref 38–126)
Anion gap: 6 (ref 5–15)
BUN: 9 mg/dL (ref 6–20)
CALCIUM: 7.3 mg/dL — AB (ref 8.9–10.3)
CHLORIDE: 107 mmol/L (ref 101–111)
CO2: 23 mmol/L (ref 22–32)
CREATININE: 0.59 mg/dL (ref 0.44–1.00)
Glucose, Bld: 101 mg/dL — ABNORMAL HIGH (ref 65–99)
Potassium: 3.9 mmol/L (ref 3.5–5.1)
Sodium: 136 mmol/L (ref 135–145)
Total Bilirubin: 0.9 mg/dL (ref 0.3–1.2)
Total Protein: 5.2 g/dL — ABNORMAL LOW (ref 6.5–8.1)

## 2015-02-24 LAB — HEMOGLOBIN AND HEMATOCRIT, BLOOD
HCT: 28.1 % — ABNORMAL LOW (ref 35.0–47.0)
HEMOGLOBIN: 9.8 g/dL — AB (ref 12.0–16.0)

## 2015-02-24 LAB — PLATELET COUNT: PLATELETS: 151 10*3/uL (ref 150–440)

## 2015-02-24 LAB — RUBELLA ANTIBODY, IGM

## 2015-02-24 NOTE — Progress Notes (Signed)
Post Partum Day 2 Subjective: no complaints  Objective: Blood pressure 124/77, pulse 88, temperature 98.9 F (37.2 C), temperature source Oral, resp. rate 18, height  (1.676 m), weight 244 lb (110.678 kg), SpO2 97 %, unknown if currently breastfeeding.  Physical Exam:  General: alert  Heart: S1S2, RRR, No M/R/G Lungs: CTA bilat, No W/R/R.  Lochia: appropriate, no clots Uterine Fundus: firm DVT Evaluation: No evidence of DVT seen on physical exam.   Recent Labs  02/22/15 1556  02/23/15 1120 02/23/15 1805 02/24/15 0554  HGB 13.1  --  11.2*  --  9.8*  HCT 38.4  --  31.8*  --  28.1*  WBC 14.8*  --  11.1*  --   --   PLT 198  < > 143* 154 151  < > = values in this interval not displayed.  Assessment/Plan: Plan for discharge tomorrow and poss later today   LOS: 2 days   Autumn [name] 02/24/2015, 9:12 AM

## 2015-02-24 NOTE — Progress Notes (Signed)
Pt. Has Lab work ordered for 0500. Lab contacted to remind them of 0500 order time. Gunnar Fusi stated someone will be on their way.

## 2015-02-25 ENCOUNTER — Encounter: Payer: Self-pay | Admitting: Obstetrics and Gynecology

## 2015-02-25 LAB — SURGICAL PATHOLOGY

## 2015-02-25 MED ORDER — DOCUSATE SODIUM 100 MG PO CAPS: 100.0000 mg | ORAL_CAPSULE | Freq: Two times a day (BID) | ORAL | Status: DC | PRN

## 2015-02-25 MED ORDER — IBUPROFEN 600 MG PO TABS: 600.0000 mg | ORAL_TABLET | Freq: Four times a day (QID) | ORAL | Status: DC

## 2015-02-25 MED ORDER — OXYCODONE-ACETAMINOPHEN 5-325 MG PO TABS: 1.0000 | ORAL_TABLET | ORAL | Status: DC | PRN

## 2015-02-25 MED ORDER — DOCUSATE SODIUM 100 MG PO CAPS
100.0000 mg | ORAL_CAPSULE | Freq: Two times a day (BID) | ORAL | Status: DC | PRN
  Administered 2015-02-25: 100 mg via ORAL
  Filled 2015-02-25: qty 1

## 2015-02-25 MED ORDER — LABETALOL HCL 200 MG PO TABS: 200.0000 mg | ORAL_TABLET | Freq: Two times a day (BID) | ORAL | Status: DC

## 2015-02-25 NOTE — Discharge Instructions (Signed)

## 2015-02-25 NOTE — Discharge Summary (Signed)
  Obstetric Discharge Summary   Patient ID: Autumn [name] MRN: [id number] DOB/AGE: December 26, 1985 29 y.o.   Date of Admission: 02/22/2015  Date of Discharge:   Admitting Diagnosis: PreE with severe features at [redacted]w[redacted]d  Secondary Diagnosis: prior classical C/S  Mode of Delivery: repeat cesarean section and tubal ligation was performed low uterine, transverse     Discharge Diagnosis: PreE with HELLP resolved   Intrapartum Procedures: tubal ligation   Post partum procedures: none  Complications: none   Brief Hospital Course     Repeat (Cesarean Section): Autumn [name] is a [id number] who underwent cesarean section on 02/22/2015 at 33+5 for worsening preE with severe features, with plts nadiring at 143, LFTs elevated to 142/66, hgb to low of 9.8 and a protein:creatinine ratio of 1.17. On day of discharge, these labs, with the exception of low hgb, had normalized.  Patient had an uncomplicated surgery; for further details of this surgery, please refer to the operative note.  Patient had an uncomplicated postpartum course.  By time of discharge on POD#3, her pain was controlled on oral pain medications; she had appropriate lochia and was ambulating, voiding without difficulty, tolerating regular diet and passing flatus.   She was deemed stable for discharge to home.    Labs: CBC Latest Ref Rng 02/24/2015 02/23/2015 02/23/2015  WBC 3.6 - 11.0 K/uL - - 11.1(H)  Hemoglobin 12.0 - 16.0 g/dL 1.4(N) - 11.2(L)  Hematocrit 35.0 - 47.0 % 28.1(L) - 31.8(L)  Platelets 150 - 440 K/uL 151 154 143(L)   O POS  Physical exam:  Blood pressure 124/73, pulse 69, temperature 98.4 F (36.9 C), temperature source Oral, resp. rate 18, height  (1.676 m), weight 110.043 kg (242 lb 9.6 oz), SpO2 98 %, unknown if currently breastfeeding. General: alert and no distress Lochia: appropriate Abdomen: soft, NT Uterine Fundus: firm Incision: healing well, no significant drainage, no dehiscence, no significant  erythema Extremities: No evidence of DVT seen on physical exam. No lower extremity edema.  Discharge Instructions: Per After Visit Summary. Activity: Advance as tolerated. Pelvic rest for 6 weeks.  Also refer to After Visit Summary Diet: Regular Medications:   Medication List    TAKE these medications        aspirin 81 MG tablet  Take 81 mg by mouth daily.     ibuprofen 600 MG tablet  Commonly known as:  ADVIL,MOTRIN  Take 1 tablet (600 mg total) by mouth every 6 (six) hours.     multivitamin-prenatal 27-0.8 MG Tabs tablet  Take 1 tablet by mouth daily at 12 noon.     oxyCODONE-acetaminophen 5-325 MG per tablet  Commonly known as:  PERCOCET/ROXICET  Take 1 tablet by mouth every 4 (four) hours as needed (for pain scale 4-7).       Outpatient follow up:      Follow-up Information    Follow up with Christeen Douglas, MD In 2 weeks.   Specialty:  Obstetrics and Gynecology   Why:  For postop check   Contact information:   [address] [place] [address] [phone]      Postpartum contraception: bilateral tubal ligation  Discharged Condition: good  Discharged to: home   Newborn Data:  Baby boy named "Titus" Apgars: APGAR (1 MIN): 8   APGAR (5 MINS): 8   APGAR (10 MINS):      Christeen Douglas, MD 02/25/2015

## 2015-02-25 NOTE — Anesthesia Postprocedure Evaluation (Signed)
  Anesthesia Post-op Note  Patient: Autumn [name]  Procedure(s) Performed: Procedure(s): CESAREAN SECTION BILATERAL TUBAL LIGATION (Bilateral)  Anesthesia type:Spinal  Patient location: PACU  Post pain: Pain level controlled  Post assessment: Post-op Vital signs reviewed, Patient's Cardiovascular Status Stable, Respiratory Function Stable, Patent Airway and No signs of Nausea or vomiting  Post vital signs: Reviewed and stable  Last Vitals:  Filed Vitals:   02/25/15 0725  BP: 124/73  Pulse: 69  Temp: 36.9 C  Resp: 18    Level of consciousness: awake, alert  and patient cooperative  Complications: No apparent anesthesia complications

## 2015-02-25 NOTE — Progress Notes (Signed)
Patient discharge to home via wheelchair with spouse.  Follow up appointment and discharge instructions given to pt.

## 2015-03-11 ENCOUNTER — Inpatient Hospital Stay: Admission: RE | Admit: 2015-03-11 | Payer: Self-pay | Source: Ambulatory Visit

## 2015-03-11 ENCOUNTER — Encounter: Payer: Self-pay | Admitting: Anesthesiology

## 2015-03-12 ENCOUNTER — Inpatient Hospital Stay: Admission: RE | Admit: 2015-03-12 | Source: Ambulatory Visit | Admitting: Obstetrics and Gynecology

## 2015-03-12 ENCOUNTER — Encounter: Admission: RE | Payer: Self-pay | Source: Ambulatory Visit

## 2015-03-12 SURGERY — Surgical Case
Anesthesia: Choice

## 2023-08-18 ENCOUNTER — Encounter (HOSPITAL_COMMUNITY): Payer: Self-pay

## 2023-08-19 ENCOUNTER — Encounter (HOSPITAL_COMMUNITY): Payer: Self-pay

## 2023-08-19 ENCOUNTER — Other Ambulatory Visit: Payer: Self-pay

## 2023-08-19 ENCOUNTER — Inpatient Hospital Stay (HOSPITAL_COMMUNITY)
Admission: AD | Admit: 2023-08-19 | Discharge: 2023-08-22 | DRG: 862 | Disposition: A | Discharge status: Home / Self Care | Source: Ambulatory Visit | Attending: Surgery | Admitting: Surgery

## 2023-08-19 DIAGNOSIS — K651 Peritoneal abscess: Secondary | ICD-10-CM | POA: Diagnosis present

## 2023-08-19 DIAGNOSIS — K760 Fatty (change of) liver, not elsewhere classified: Secondary | ICD-10-CM | POA: Diagnosis present

## 2023-08-19 DIAGNOSIS — R188 Other ascites: Secondary | ICD-10-CM | POA: Diagnosis present

## 2023-08-19 DIAGNOSIS — Z9851 Tubal ligation status: Secondary | ICD-10-CM | POA: Diagnosis not present

## 2023-08-19 DIAGNOSIS — F419 Anxiety disorder, unspecified: Secondary | ICD-10-CM | POA: Diagnosis present

## 2023-08-19 DIAGNOSIS — Y836 Removal of other organ (partial) (total) as the cause of abnormal reaction of the patient, or of later complication, without mention of misadventure at the time of the procedure: Secondary | ICD-10-CM | POA: Diagnosis present

## 2023-08-19 DIAGNOSIS — L0390 Cellulitis, unspecified: Secondary | ICD-10-CM | POA: Diagnosis present

## 2023-08-19 DIAGNOSIS — Z9049 Acquired absence of other specified parts of digestive tract: Secondary | ICD-10-CM | POA: Diagnosis not present

## 2023-08-19 DIAGNOSIS — Z98891 History of uterine scar from previous surgery: Secondary | ICD-10-CM | POA: Diagnosis not present

## 2023-08-19 DIAGNOSIS — E663 Overweight: Secondary | ICD-10-CM | POA: Diagnosis present

## 2023-08-19 DIAGNOSIS — Z7982 Long term (current) use of aspirin: Secondary | ICD-10-CM | POA: Diagnosis not present

## 2023-08-19 DIAGNOSIS — I10 Essential (primary) hypertension: Secondary | ICD-10-CM | POA: Diagnosis present

## 2023-08-19 DIAGNOSIS — Z6839 Body mass index (BMI) 39.0-39.9, adult: Secondary | ICD-10-CM | POA: Diagnosis not present

## 2023-08-19 DIAGNOSIS — B999 Unspecified infectious disease: Principal | ICD-10-CM | POA: Diagnosis present

## 2023-08-19 DIAGNOSIS — T8143XA Infection following a procedure, organ and space surgical site, initial encounter: Principal | ICD-10-CM | POA: Diagnosis present

## 2023-08-19 LAB — COMPREHENSIVE METABOLIC PANEL
ALT: 16 U/L (ref 0–44)
AST: 15 U/L (ref 15–41)
Albumin: 2.6 g/dL — ABNORMAL LOW (ref 3.5–5.0)
Alkaline Phosphatase: 73 U/L (ref 38–126)
Anion gap: 10 (ref 5–15)
BUN: <5 mg/dL — ABNORMAL LOW (ref 6–20)
CO2: 22 mmol/L (ref 22–32)
Calcium: 8 mg/dL — ABNORMAL LOW (ref 8.9–10.3)
Chloride: 102 mmol/L (ref 98–111)
Creatinine, Ser: 0.8 mg/dL (ref 0.44–1.00)
GFR, Estimated: >60 mL/min (ref >60)
Glucose, Bld: 117 mg/dL — ABNORMAL HIGH (ref 70–99)
Potassium: 3.4 mmol/L — ABNORMAL LOW (ref 3.5–5.1)
Sodium: 134 mmol/L — ABNORMAL LOW (ref 135–145)
Total Bilirubin: 1.3 mg/dL — ABNORMAL HIGH (ref 0.0–1.2)
Total Protein: 5.9 g/dL — ABNORMAL LOW (ref 6.5–8.1)

## 2023-08-19 LAB — CBC WITH DIFFERENTIAL/PLATELET
Abs Immature Granulocytes: 0.08 10*3/uL — ABNORMAL HIGH (ref 0.00–0.07)
Basophils Absolute: 0 10*3/uL (ref 0.0–0.1)
Basophils Relative: 0 %
Eosinophils Absolute: 0.1 10*3/uL (ref 0.0–0.5)
Eosinophils Relative: 1 %
HCT: 27.3 % — ABNORMAL LOW (ref 36.0–46.0)
Hemoglobin: 8.2 g/dL — ABNORMAL LOW (ref 12.0–15.0)
Immature Granulocytes: 1 %
Lymphocytes Relative: 11 %
Lymphs Abs: 1.3 10*3/uL (ref 0.7–4.0)
MCH: 22.2 pg — ABNORMAL LOW (ref 26.0–34.0)
MCHC: 30 g/dL (ref 30.0–36.0)
MCV: 73.8 fL — ABNORMAL LOW (ref 80.0–100.0)
Monocytes Absolute: 1.1 10*3/uL — ABNORMAL HIGH (ref 0.1–1.0)
Monocytes Relative: 9 %
Neutro Abs: 9.6 10*3/uL — ABNORMAL HIGH (ref 1.7–7.7)
Neutrophils Relative %: 78 %
Platelets: 376 10*3/uL (ref 150–400)
RBC: 3.7 MIL/uL — ABNORMAL LOW (ref 3.87–5.11)
RDW: 17.9 % — ABNORMAL HIGH (ref 11.5–15.5)
WBC: 12.1 10*3/uL — ABNORMAL HIGH (ref 4.0–10.5)
nRBC: 0.2 % (ref 0.0–0.2)

## 2023-08-19 MED ORDER — MELATONIN 3 MG PO TABS
3.0000 mg | ORAL_TABLET | Freq: Every evening | ORAL | Status: DC | PRN
  Administered 2023-08-19 – 2023-08-21 (×3): 3 mg via ORAL
  Filled 2023-08-19 (×3): qty 1

## 2023-08-19 MED ORDER — HEPARIN SODIUM (PORCINE) 5000 UNIT/ML IJ SOLN
5000.0000 [IU] | Freq: Three times a day (TID) | INTRAMUSCULAR | Status: DC
  Administered 2023-08-19 – 2023-08-22 (×9): 5000 [IU] via SUBCUTANEOUS
  Filled 2023-08-19 (×9): qty 1

## 2023-08-19 MED ORDER — ONDANSETRON HCL 4 MG/2ML IJ SOLN
4.0000 mg | Freq: Four times a day (QID) | INTRAMUSCULAR | Status: DC | PRN
  Administered 2023-08-21: 4 mg via INTRAVENOUS
  Filled 2023-08-19: qty 2

## 2023-08-19 MED ORDER — DIPHENHYDRAMINE HCL 12.5 MG/5ML PO ELIX: 12.5000 mg | ORAL_SOLUTION | Freq: Four times a day (QID) | ORAL | Status: DC | PRN

## 2023-08-19 MED ORDER — ONDANSETRON 4 MG PO TBDP
4.0000 mg | ORAL_TABLET | Freq: Four times a day (QID) | ORAL | Status: DC | PRN
  Administered 2023-08-19 – 2023-08-22 (×3): 4 mg via ORAL
  Filled 2023-08-19 (×3): qty 1

## 2023-08-19 MED ORDER — ACETAMINOPHEN 500 MG PO TABS
1000.0000 mg | ORAL_TABLET | Freq: Four times a day (QID) | ORAL | Status: DC
  Administered 2023-08-19 – 2023-08-22 (×12): 1000 mg via ORAL
  Filled 2023-08-19 (×13): qty 2

## 2023-08-19 MED ORDER — PIPERACILLIN-TAZOBACTAM 3.375 G IVPB
3.3750 g | Freq: Three times a day (TID) | INTRAVENOUS | Status: DC
  Administered 2023-08-20 – 2023-08-22 (×8): 3.375 g via INTRAVENOUS
  Filled 2023-08-19 (×8): qty 50

## 2023-08-19 MED ORDER — TRAMADOL HCL 50 MG PO TABS
50.0000 mg | ORAL_TABLET | Freq: Four times a day (QID) | ORAL | Status: DC | PRN
  Administered 2023-08-19 – 2023-08-22 (×6): 50 mg via ORAL
  Filled 2023-08-19 (×7): qty 1

## 2023-08-19 MED ORDER — DOCUSATE SODIUM 100 MG PO CAPS
200.0000 mg | ORAL_CAPSULE | Freq: Two times a day (BID) | ORAL | Status: DC
  Administered 2023-08-19 – 2023-08-21 (×3): 200 mg via ORAL
  Filled 2023-08-19 (×6): qty 2

## 2023-08-19 MED ORDER — POTASSIUM CHLORIDE CRYS ER 20 MEQ PO TBCR
30.0000 meq | EXTENDED_RELEASE_TABLET | Freq: Once | ORAL | Status: AC
  Administered 2023-08-19: 30 meq via ORAL
  Filled 2023-08-19: qty 1

## 2023-08-19 MED ORDER — HYDROMORPHONE HCL 1 MG/ML IJ SOLN: 0.5000 mg | INTRAMUSCULAR | Status: DC | PRN

## 2023-08-19 MED ORDER — SIMETHICONE 80 MG PO CHEW: 40.0000 mg | CHEWABLE_TABLET | Freq: Four times a day (QID) | ORAL | Status: DC | PRN

## 2023-08-19 MED ORDER — DIPHENHYDRAMINE HCL 50 MG/ML IJ SOLN: 12.5000 mg | Freq: Four times a day (QID) | INTRAMUSCULAR | Status: DC | PRN

## 2023-08-19 MED ORDER — PIPERACILLIN-TAZOBACTAM 3.375 G IVPB 30 MIN
3.3750 g | Freq: Once | INTRAVENOUS | Status: AC
  Administered 2023-08-19: 3.375 g via INTRAVENOUS
  Filled 2023-08-19: qty 50

## 2023-08-19 MED ORDER — IBUPROFEN 200 MG PO TABS
600.0000 mg | ORAL_TABLET | Freq: Four times a day (QID) | ORAL | Status: DC | PRN
  Administered 2023-08-20 – 2023-08-21 (×2): 600 mg via ORAL
  Filled 2023-08-19 (×3): qty 3

## 2023-08-19 MED ORDER — DEXTROSE-SODIUM CHLORIDE 5-0.45 % IV SOLN: INTRAVENOUS | Status: AC

## 2023-08-19 MED ORDER — HYDRALAZINE HCL 20 MG/ML IJ SOLN: 10.0000 mg | INTRAMUSCULAR | Status: DC | PRN

## 2023-08-19 NOTE — H&P (Signed)
 CC: Intra-abdominal abscess s/p appendectomy at outside hospital  HPI: Autumn [name] is an 38 y.o. female with hx of HTN, anxiety who is here for suspected intra-abdominal fluid collection after appendectomy at outside hospital  CT Abd/Pelvis 08/18/23 (through [facility] but imaging/report visible in PACS) "1. Interval appendectomy with likely associated postoperative seroma measuring 5.7 x 7.7 x 7.4 cm. There is slightly decreased fat stranding in the right lower quadrant. 2. Mild hepatomegaly and mild diffuse hepatic steatosis. 3. No other acute findings in the abdomen or pelvis as imaged."  Initial CT Abd/pelvis report 08/08/23 "Acute appendicitis with extensive surrounding inflammation and fluid. No definite periappendiceal abscess. no large pneumoperitoneum however microperforation is not entirely excluded. No bowel obstruction. "  My partner, Dr. Sheliah Hatch, was on call for our practice last night and had accepted her in transfer. I have been asked to see her tonight on arrival at our hospital  History obtained from her is currently, the only records I can see are the imaging reports and our system.  She reports that she underwent laparoscopic converted to open appendectomy on 08/08/2023 by Dr. Terrilee Files.  She states that due to prior C-sections she did have a lot of scar tissue adhesions making the procedure difficult and reports open conversion was made.  She reports she was admitted postoperatively and recovered reasonably well.  She reports that she had IV antibiotics for an extra day in the hospital and was discharged home that Wednesday, 08/10/2023.  She had a slow recovery at home.  She reports that in the days following she had developed some lethargy and malaise with subsequent significant drainage of reddish-yellow fluid from her midline incision.  She reports that there is a large volume of fluid pouring from her wound.  She reports that she had her 14 year old son bring her  thermometer and that her temperature was 101.5.  She states that she had discussed things with her surgeon/office but was initially reassured.  She reports that she then presented to the emergency department at [facility] yesterday evening.  She underwent the workup noted above with a CT scan demonstrating a evident intra-abdominal fluid collection in the surgical bed of the appendectomy.  She was subsequently discussed with my partner and excepted here for potential interventional radiology care as these resources are not present at [facility].  Currently, reports she is feeling reasonably well.  Some nausea but no emesis.  She reports she has had no emesis over the last week.  She is having regular bowel function.  She reports mild right lower quadrant discomfort.  She denies any pain elsewhere in her abdomen.  She denies any other complaints at present.  Her husband is present at bedside.  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 No pertinent family history.  Social:  reports that she has never smoked. She has never used smokeless tobacco. She reports that she does not drink alcohol and does not use drugs.  Allergies: No Known Allergies  Medications: I have reviewed the patient's current  medications.  No results found for this or any previous visit (from the past 48 hours).  No results found.  ROS - all of the below systems have been reviewed with the patient and positives are indicated with bold text General: chills, fever or night sweats Eyes: blurry vision or double vision ENT: epistaxis or sore throat Allergy/Immunology: itchy/watery eyes or nasal congestion Hematologic/Lymphatic: bleeding problems, blood clots or swollen lymph nodes Endocrine: temperature intolerance or unexpected weight changes Breast: new or changing breast lumps or nipple discharge Resp: cough, shortness of breath, or wheezing CV: chest pain or dyspnea on exertion GI: as per HPI GU: dysuria, trouble voiding, or hematuria MSK: joint pain or joint stiffness Neuro: TIA or stroke symptoms Derm: pruritus and skin lesion changes Psych: anxiety and depression  PE Height 5\' 4"  (1.626 m), weight 104.5 kg, unknown if currently breastfeeding. Constitutional: NAD; conversant Eyes: Moist conjunctiva Lungs: Normal respiratory effort CV: RRR; no pitting edema GI: Abd soft, low midline incision is without erythema but there is scant clearish yellow drainage.  No fluctuance.  Minimal RLQ tenderness; not significantly distended.  No tenderness or discomfort elsewhere. Psychiatric: Appropriate affect  No results found for this or any previous visit (from the past 48 hours).  No results found.   A/P: Autumn [name] is an 38 y.o. female with hx HTN, anxiety s/p open appendectomy at [facility] complicated by development of presumed intra-abdominal abscess - transferred to our facility for IR consultation for possible drainage  - CBC, CMP - Regular diet this evening, NPO after midnight - IV Zosyn - IR consultation  -We spent time reviewing the imaging findings noted at [facility].  We discussed the plan as noted above.  We discussed the etiology of intradomal fluid collections after the surgery.   All of her and her husband's questions were answered to the satisfaction, they expressed understanding, and agreement with the plan.  I spent a total of 75 minutes in both face-to-face and non-face-to-face activities, excluding procedures performed, for this visit on the date of this encounter.  Marin Olp, MD [facility]

## 2023-08-19 NOTE — Progress Notes (Signed)
 Pharmacy Antibiotic Note  Autumn [name] is a 38 y.o. female admitted on 08/19/2023 with  intra-abdominal infection .  Pharmacy has been consulted for zosyn dosing.  Presenting with suspected intra-abdominal fluid collection after appendectomy at OSH. WBC up to 12, Tmax 100.8, Scr 0.8 (CrCl >100 mL/min). Received cefepime, vancomycin (2g on 3/14@1611 ) and metronidazole at OSH prior to arrival.   Plan: Zosyn 3.375g IV q8h (4 hour infusion). Monitor renal fx, clinical pic, and cx results  Height: 5\' 4"  (162.6 cm) Weight: 104.5 kg (230 lb 6.1 oz) IBW/kg (Calculated) : 54.7  No data recorded.  No results for input(s): "WBC", "CREATININE", "LATICACIDVEN", "VANCOTROUGH", "VANCOPEAK", "VANCORANDOM", "GENTTROUGH", "GENTPEAK", "GENTRANDOM", "TOBRATROUGH", "TOBRAPEAK", "TOBRARND", "AMIKACINPEAK", "AMIKACINTROU", "AMIKACIN" in the last 168 hours.  CrCl cannot be calculated (Patient's most recent lab result is older than the maximum 21 days allowed.).    No Known Allergies  Antimicrobials this admission: Zosyn 3/14 >>   Dose adjustments this admission: N/A  Microbiology results: None  Thank you for allowing pharmacy to participate in this patient's care,  Sherron Monday, PharmD, BCCCP Clinical Pharmacist  Phone: [phone] 08/19/2023 9:02 PM  Please check AMION for all [facility] Pharmacy phone numbers After 10:00 PM, call Main Pharmacy [phone]

## 2023-08-20 ENCOUNTER — Inpatient Hospital Stay (HOSPITAL_COMMUNITY)

## 2023-08-20 LAB — SURGICAL PCR SCREEN
MRSA, PCR: NEGATIVE
Staphylococcus aureus: NEGATIVE

## 2023-08-20 LAB — HIV ANTIBODY (ROUTINE TESTING W REFLEX): HIV Screen 4th Generation wRfx: NONREACTIVE

## 2023-08-20 MED ORDER — LIDOCAINE HCL 1 % IJ SOLN
INTRAMUSCULAR | Status: AC
Start: 2023-08-20 — End: ?
  Filled 2023-08-20: qty 10

## 2023-08-20 MED ORDER — MIDAZOLAM HCL 2 MG/2ML IJ SOLN
INTRAMUSCULAR | Status: AC | PRN
Start: 2023-08-20 — End: 2023-08-20
  Administered 2023-08-20 (×4): 1 mg via INTRAVENOUS

## 2023-08-20 MED ORDER — FENTANYL CITRATE (PF) 100 MCG/2ML IJ SOLN
INTRAMUSCULAR | Status: AC | PRN
  Administered 2023-08-20 (×4): 50 ug via INTRAVENOUS

## 2023-08-20 MED ORDER — FENTANYL CITRATE (PF) 100 MCG/2ML IJ SOLN
INTRAMUSCULAR | Status: AC
  Filled 2023-08-20: qty 4

## 2023-08-20 MED ORDER — MIDAZOLAM HCL 2 MG/2ML IJ SOLN
INTRAMUSCULAR | Status: AC
  Filled 2023-08-20: qty 4

## 2023-08-20 NOTE — Progress Notes (Signed)
   Progress Note     Subjective: Pt reports purulent drainage from midline incision although less than initially draining. Ongoing pain and nausea.   Objective: Vital signs in last 24 hours: Temp:  [98.2 F (36.8 C)-100.8 F (38.2 C)] 98.6 F (37 C) (03/15 0918) Pulse Rate:  [73-106] 73 (03/15 0918) Resp:  [17-20] 18 (03/15 0918) BP: (110-133)/(68-80) 110/68 (03/15 0918) SpO2:  [94 %-100 %] 94 % (03/15 0918) Weight:  [104.5 kg] 104.5 kg (03/14 1800)    Intake/Output from previous day: No intake/output data recorded. Intake/Output this shift: Total I/O In: 806.9 [I.V.:756.9; IV Piggyback:50] Out: -   PE: General: pleasant, WD, overweight female who is laying in bed in NAD Heart: regular, rate, and rhythm.  Lungs:  Respiratory effort nonlabored Abd: soft, appropriately ttp, ND, some cellulitis and purulence at inferior midline incision, staples removed and purulent drainage expressed Psych: A&Ox3 with an appropriate affect.    Lab Results:  Recent Labs    08/19/23 1854  WBC 12.1*  HGB 8.2*  HCT 27.3*  PLT 376   BMET Recent Labs    08/19/23 1854  NA 134*  K 3.4*  CL 102  CO2 22  GLUCOSE 117*  BUN <5*  CREATININE 0.80  CALCIUM 8.0*   PT/INR No results for input(s): "LABPROT", "INR" in the last 72 hours. CMP     Component Value Date/Time   NA 134 (L) 08/19/2023 1854   K 3.4 (L) 08/19/2023 1854   CL 102 08/19/2023 1854   CO2 22 08/19/2023 1854   GLUCOSE 117 (H) 08/19/2023 1854   BUN <5 (L) 08/19/2023 1854   CREATININE 0.80 08/19/2023 1854   CALCIUM 8.0 (L) 08/19/2023 1854   PROT 5.9 (L) 08/19/2023 1854   ALBUMIN 2.6 (L) 08/19/2023 1854   AST 15 08/19/2023 1854   ALT 16 08/19/2023 1854   ALKPHOS 73 08/19/2023 1854   BILITOT 1.3 (H) 08/19/2023 1854   GFRNONAA >60 08/19/2023 1854   GFRAA >60 02/24/2015 0554   Lipase  No results found for: "LIPASE"     Studies/Results: No results found.  Anti-infectives: Anti-infectives (From admission,  onward)    Start     Dose/Rate Route Frequency Ordered Stop   08/20/23 0500  piperacillin-tazobactam (ZOSYN) IVPB 3.375 g        3.375 g 12.5 mL/hr over 240 Minutes Intravenous Every 8 hours 08/19/23 2105     08/19/23 1945  piperacillin-tazobactam (ZOSYN) IVPB 3.375 g        3.375 g 100 mL/hr over 30 Minutes Intravenous  Once 08/19/23 1851 08/19/23 2128        Assessment/Plan  S/P lap converted to open appendectomy at [facility] 08/08/23 Dr. Georgiana Shore  Post-operative abscess - IR consulted and planning drainage today  - removed staples from inferior midline incision - BID WTD dressing packing into tract superiorly  - continue abx - ok to have CLD after IR procedure  FEN: NPO, IVF @ 75 cc/h  VTE: SQH ID: [id number] 3/14>>  LOS: 1 day   I reviewed Consultant IR notes, last 24 h vitals and pain scores, last 48 h intake and output, last 24 h labs and trends, and last 24 h imaging results.  This care required moderate level of medical decision making.    Juliet Rude, [facility] Surgery 08/20/2023, 10:10 AM Please see Amion for pager number during day hours 7:00am-4:30pm

## 2023-08-20 NOTE — Plan of Care (Signed)
   Problem: Health Behavior/Discharge Planning: Goal: Ability to manage health-related needs will improve Outcome: Progressing

## 2023-08-20 NOTE — Procedures (Signed)
 Interventional Radiology Procedure Note  Procedure: CT Guided Drainage of RLQ peritoneal abscess  Complications: None  Estimated Blood Loss: < 10 mL  Findings: 12 Fr drain placed in post-op RLQ abscess with return of purulent fluid. Fluid sample sent for culture analysis. Drain attached to suction bulb drainage.  Will follow.  Jodi Marble. Fredia Sorrow, M.D Pager:  [phone]

## 2023-08-20 NOTE — Consult Note (Addendum)
 Chief Complaint: Patient was seen in consultation today for intra-abdominal fluid collection with consideration for drainage.  Referring Provider(s): Dr. Marin Olp, MD  Supervising Physician: Irish Lack  Patient Status: [facility] - In-pt  Patient is Full Code  History of Present Illness: Autumn [name] is a 38 y.o. female woth PMHx of anxiety. Autumn [name] received most of her care at [facility], and few notes and no imaging are available for review at this time.  Per Dr. Lucilla Lame H&P note yesterday: "Patient is here for suspected intra-abdominal fluid collection after appendectomy at outside hospital   CT Abd/Pelvis 08/18/23 (through [place] but imaging/report visible in PACS) "1. Interval appendectomy with likely associated postoperative seroma measuring 5.7 x 7.7 x 7.4 cm. There is slightly decreased fat stranding in the right lower quadrant. 2. Mild hepatomegaly and mild diffuse hepatic steatosis. 3. No other acute findings in the abdomen or pelvis as imaged."   Initial CT Abd/pelvis report 08/08/23 "Acute appendicitis with extensive surrounding inflammation and fluid. No definite periappendiceal abscess. no large pneumoperitoneum however microperforation is not entirely excluded. No bowel obstruction. "   My partner, Dr. Sheliah Hatch, was on call for our practice last night and had accepted her in transfer. I have been asked to see her tonight on arrival at our hospital   History obtained from her is currently, the only records I can see are the imaging reports and our system.   She reports that she underwent laparoscopic converted to open appendectomy on 08/08/2023 by Dr. Terrilee Files.  She states that due to prior C-sections she did have a lot of scar tissue adhesions making the procedure difficult and reports open conversion was made.   She reports she was admitted postoperatively and recovered reasonably well.  She reports that she had IV antibiotics for an  extra day in the hospital and was discharged home that Wednesday, 08/10/2023.  She had a slow recovery at home.  She reports that in the days following she had developed some lethargy and malaise with subsequent significant drainage of reddish-yellow fluid from her midline incision.  She reports that there is a large volume of fluid pouring from her wound.  She reports that she had her 58 year old son bring her thermometer and that her temperature was 101.5.  She states that she had discussed things with her surgeon/office but was initially reassured.   She reports that she then presented to the emergency department at [facility] yesterday evening.  She underwent the workup noted above with a CT scan demonstrating a evident intra-abdominal fluid collection in the surgical bed of the appendectomy.  She was subsequently discussed with my partner and excepted here for potential interventional radiology care as these resources are not present at [facility]."  Interventional Radiology was requested for intra-abdominal fluid collection drainage. Request was reviewed and approved by Dr. Fredia Sorrow. Patient is scheduled for same in IR today.  All labs and medications are within acceptable parameters. No pertinent allergies. Patient has been NPO since midnight.    Currently with tenderness to lower and mid abdomen around surgical intervention sites. Otherwise, without significant complaints. Patient alert and laying in bed,calm. Denies any fevers, headache, chest pain, SOB, cough, nausea, vomiting or bleeding.     Past Medical History:  Diagnosis Date   Anxiety    Hypertension     Past Surgical History:  Procedure Laterality Date   CESAREAN SECTION     CESAREAN SECTION  02/22/2015   Procedure: CESAREAN SECTION;  Surgeon: Christeen Douglas, MD;  Location: [facility] ORS;  Service: Obstetrics;;   TONSILLECTOMY     TUBAL LIGATION Bilateral 02/22/2015   Procedure: BILATERAL TUBAL LIGATION;  Surgeon:  Christeen Douglas, MD;  Location: [facility] ORS;  Service: Obstetrics;  Laterality: Bilateral;    Allergies: Patient has no known allergies.  Medications: Prior to Admission medications   Medication Sig Start Date End Date Taking? Authorizing Provider  aspirin 81 MG tablet Take 81 mg by mouth daily.    [provider]  docusate sodium (COLACE) 100 MG capsule Take 1 capsule (100 mg total) by mouth 2 (two) times daily as needed for mild constipation. 02/25/15   Christeen Douglas, MD  ibuprofen (ADVIL,MOTRIN) 600 MG tablet Take 1 tablet (600 mg total) by mouth every 6 (six) hours. 02/25/15   Christeen Douglas, MD  labetalol (NORMODYNE) 200 MG tablet Take 1 tablet (200 mg total) by mouth 2 (two) times daily. 02/25/15   Christeen Douglas, MD  oxyCODONE-acetaminophen (PERCOCET/ROXICET) 5-325 MG per tablet Take 1 tablet by mouth every 4 (four) hours as needed (for pain scale 4-7). 02/25/15   Christeen Douglas, MD  Prenatal Vit-Fe Fumarate-FA (MULTIVITAMIN-PRENATAL) 27-0.8 MG TABS tablet Take 1 tablet by mouth daily at 12 noon.    [provider]     History reviewed. No pertinent family history.  Social History   Socioeconomic History   Marital status: Married    Spouse name: Not on file   Number of children: Not on file   Years of education: Not on file   Highest education level: Not on file  Occupational History   Not on file  Tobacco Use   Smoking status: Never   Smokeless tobacco: Never  Substance and Sexual Activity   Alcohol use: No   Drug use: No   Sexual activity: Yes    Birth control/protection: Surgical  Other Topics Concern   Not on file  Social History Narrative   Not on file   Social Drivers of Health   Financial Resource Strain: Not on file  Food Insecurity: No Food Insecurity (08/19/2023)   Hunger Vital Sign    Worried About Running Out of Food in the Last Year: Never true    Ran Out of Food in the Last Year: Never true  Transportation Needs: No  Transportation Needs (08/19/2023)   PRAPARE - Administrator, Civil Service (Medical): No    Lack of Transportation (Non-Medical): No  Physical Activity: Not on file  Stress: Not on file  Social Connections: Not on file     Review of Systems: A 12 point ROS discussed and pertinent positives are indicated in the HPI above.  All other systems are negative.  Vital Signs: BP 123/69 (BP Location: Left Arm)   Pulse 90   Temp (!) 100.7 F (38.2 C)   Resp 18   Ht 5\' 4"  (1.626 m)   Wt 230 lb 6.1 oz (104.5 kg)   SpO2 100%   BMI 39.54 kg/m   Advance Care Plan: The advanced care place/surrogate decision maker was discussed at the time of visit and the patient did not wish to discuss or was not able to name a surrogate decision maker or provide an advance care plan.  Physical Exam Constitutional:      General: She is not in acute distress.    Appearance: Normal appearance.  HENT:     Mouth/Throat:     Mouth: Mucous membranes are dry.  Cardiovascular:     Rate  and Rhythm: Normal rate.     Heart sounds: No murmur heard. Pulmonary:     Effort: Pulmonary effort is normal.     Breath sounds: Normal breath sounds. No wheezing.  Abdominal:     General: Abdomen is flat.     Palpations: Abdomen is soft.     Comments: Right of lower midline surgical incision site exposed, with retained staples, and minimally oozing serous fluid. Tender to palpation at incision site and RLQ.   Musculoskeletal:        General: Normal range of motion.     Cervical back: Normal range of motion.  Skin:    General: Skin is warm and dry.  Neurological:     Mental Status: She is alert and oriented to person, place, and time.  Psychiatric:        Mood and Affect: Mood normal.        Behavior: Behavior normal.        Thought Content: Thought content normal.        Judgment: Judgment normal.     Imaging: No results found.  Labs:  CBC: Recent Labs    08/19/23 1854  WBC 12.1*  HGB 8.2*   HCT 27.3*  PLT 376    COAGS: No results for input(s): "INR", "APTT" in the last 8760 hours.  BMP: Recent Labs    08/19/23 1854  NA 134*  K 3.4*  CL 102  CO2 22  GLUCOSE 117*  BUN <5*  CALCIUM 8.0*  CREATININE 0.80  GFRNONAA >60    LIVER FUNCTION TESTS: Recent Labs    08/19/23 1854  BILITOT 1.3*  AST 15  ALT 16  ALKPHOS 73  PROT 5.9*  ALBUMIN 2.6*    TUMOR MARKERS: No results for input(s): "AFPTM", "CEA", "CA199", "CHROMGRNA" in the last 8760 hours.  Assessment and Plan: "Patient reports that she underwent laparoscopic converted to open appendectomy on 08/08/2023 by Dr. Terrilee Files. Post-operative recovery complicated by tenderness to site with ample drainage and fever.CT A?P on 3/13 with likely associated postoperative seroma measuring 5.7 x 7.7 x 7.4 cm. There is slightly decreased fat stranding in the right lower quadrant. Patient was transferred to [facility] for IR drain placement.  Patient presents for scheduled intra-abdominal fluid collection drainage in IR today.  Risks and benefits discussed with the patient including bleeding, infection, damage to adjacent structures, bowel perforation/fistula connection, and sepsis.  All of the patient's questions were answered, patient is agreeable to proceed. Consent signed and in APP office.   Thank you for allowing our service to participate in Autumn [name] 's care.  Electronically Signed: Sable Feil, PA-C   08/20/2023, 9:10 AM      I spent a total of 40 Minutes in face to face in clinical consultation, greater than 50% of which was counseling/coordinating care for intra-abdominal fluid collection with consideration for drainage.

## 2023-08-21 NOTE — Progress Notes (Signed)
 Referring Provider(s): Dr. Marin Olp, MD   Supervising Physician: Irish Lack  Patient Status:  [name] - In-pt  Chief Complaint: intra-abdominal fluid collection  Brief History:  Patient underwent laparoscopic converted to open appendectomy on 08/08/2023 by Dr. Terrilee Files at [facility]. Her post-operative recovery was complicated by tenderness to the incision site, with fevers and notable drainage. CT A?P on 3/13 notable for likely associated postoperative seroma measuring 5.7 x 7.7 x 7.4 cm. Patient was transferred to [place] [name] and a drain placed on 08/20/23.  Subjective:  Patient is laying in bed, feeling somewhat nauseated after her first meal since procedure. She had liquids this morning only.  No new complaints. Overall, feeling improved some from yesterday. Pt denies HA, CP, SOB, vomiting, fevers or chills.    Allergies: Patient has no known allergies.  Medications: Prior to Admission medications   Medication Sig Start Date End Date Taking? Authorizing Provider  ondansetron (ZOFRAN-ODT) 4 MG disintegrating tablet Take 4 mg by mouth every 6 (six) hours as needed for nausea or vomiting. 08/10/23  Yes [provider]  oxyCODONE (OXY IR/ROXICODONE) 5 MG immediate release tablet Take 5 mg by mouth every 6 (six) hours as needed. Patient not taking: Reported on 08/20/2023 08/10/23   [provider]     Vital Signs: BP 111/71 (BP Location: Left Arm)   Pulse 62   Temp 97.6 F (36.4 C)   Resp 18   Ht 5\' 4"  (1.626 m)   Wt 230 lb 6.1 oz (104.5 kg)   SpO2 98%   BMI 39.54 kg/m   Physical Exam Constitutional:      Appearance: Normal appearance.  Cardiovascular:     Rate and Rhythm: Normal rate.  Pulmonary:     Effort: Pulmonary effort is normal.  Abdominal:     General: Abdomen is flat. There is no distension.     Palpations: Abdomen is soft.     Tenderness: There is abdominal tenderness.     Comments: Midline abdominal  incision dressed appropriately. Clean, dry, intact. Retained staples present. Drain incision site clean, dry, intact, approprietly dressed. Securing stitch in place. Flushes well. Purulent discharge in bulb. 325 cc output yesterday.   Musculoskeletal:        General: Normal range of motion.  Skin:    General: Skin is warm and dry.  Neurological:     Mental Status: She is alert and oriented to person, place, and time.      Labs:  CBC: Recent Labs    08/19/23 1854  WBC 12.1*  HGB 8.2*  HCT 27.3*  PLT 376    COAGS: No results for input(s): "INR", "APTT" in the last 8760 hours.  BMP: Recent Labs    08/19/23 1854  NA 134*  K 3.4*  CL 102  CO2 22  GLUCOSE 117*  BUN <5*  CALCIUM 8.0*  CREATININE 0.80  GFRNONAA >60    LIVER FUNCTION TESTS: Recent Labs    08/19/23 1854  BILITOT 1.3*  AST 15  ALT 16  ALKPHOS 73  PROT 5.9*  ALBUMIN 2.6*    Assessment and Plan:  Drain Location: RLQ Size: Fr size: 12 Fr Date of placement: 08/20/23  Currently to: Drain collection device: suction bulb 24 hour output:  Output by Drain (mL) 08/19/23 0701 - 08/19/23 1900 08/19/23 1901 - 08/20/23 0700 08/20/23 0701 - 08/20/23 1900 08/20/23 1901 - 08/21/23 0700 08/21/23 0701 - 08/21/23 1337  Closed System Drain 1 Right RLQ Bulb (JP)  12 Fr.   325  50    Interval imaging/drain manipulation:  None.  Current examination: Flushes/aspirates easily.  Insertion site unremarkable. Suture and stat lock in place. Dressed appropriately.   Plan: Continue TID flushes with 5 cc NS. Record output Q shift. Dressing changes QD or PRN if soiled.  Call IR APP or on call IR MD if difficulty flushing or sudden change in drain output.  Repeat imaging/possible drain injection once output < 10 mL/QD (excluding flush material). Consideration for drain removal if output is < 10 mL/QD (excluding flush material), pending discussion with the providing surgical service.  Discharge planning: Please  contact IR APP or on call IR MD prior to patient d/c to ensure appropriate follow up plans are in place. Typically patient will follow up with IR clinic 10-14 days post d/c for repeat imaging/possible drain injection. IR scheduler will contact patient with date/time of appointment. Patient will need to flush drain QD with 5 cc NS, record output QD, dressing changes every 2-3 days or earlier if soiled.   IR will continue to follow - please call with questions or concerns.     Electronically Signed: Sable Feil, PA-C 08/21/2023, 1:37 PM     I spent a total of 15 Minutes at the the patient's bedside AND on the patient's [name] floor or unit, greater than 50% of which was counseling/coordinating care for intra-abdominal fluid collection with consideration for drainage.

## 2023-08-21 NOTE — Plan of Care (Signed)
   Problem: Activity: Goal: Risk for activity intolerance will decrease Outcome: Progressing   Problem: Nutrition: Goal: Adequate nutrition will be maintained Outcome: Progressing   Problem: Coping: Goal: Level of anxiety will decrease Outcome: Progressing   Problem: Pain Managment: Goal: General experience of comfort will improve and/or be controlled Outcome: Progressing

## 2023-08-21 NOTE — Progress Notes (Signed)
 Assessment & Plan: S/P lap converted to open appendectomy at [facility] 08/08/23 - Dr. Georgiana Shore  Post-operative intra-abdominal abscess - IR drain placed with purulent output - abdominal wound infection - BID WTD dressing changes - continue IV Zosyn - resume regular diet today   FEN: Reg diet, IVF VTE: SQH ID: [email or web address] 3/14>>        Darnell Level, MD [facility] Surgery A [facility] practice Office: [phone]        Chief Complaint: Appendicitis, intra-abdominal abscess  Subjective: Patient in bed, comfortable.  Tolerating liquid diet.  Objective: Vital signs in last 24 hours: Temp:  [97.6 F (36.4 C)-98.9 F (37.2 C)] 97.6 F (36.4 C) (03/16 0746) Pulse Rate:  [62-82] 62 (03/16 0746) Resp:  [14-26] 18 (03/16 0746) BP: (101-118)/(60-73) 111/71 (03/16 0746) SpO2:  [93 %-100 %] 98 % (03/16 0746) Last BM Date : 08/20/23  Intake/Output from previous day: 03/15 0701 - 03/16 0700 In: 1608.3 [I.V.:1458.6; IV Piggyback:129.8] Out: 325 [Drains:325] Intake/Output this shift: Total I/O In: -  Out: 50 [Drains:50]  Physical Exam: HEENT - sclerae clear, mucous membranes moist Abdomen - soft, obese; dressing dry and intact; drain with thin brown purulent output  Lab Results:  Recent Labs    08/19/23 1854  WBC 12.1*  HGB 8.2*  HCT 27.3*  PLT 376   BMET Recent Labs    08/19/23 1854  NA 134*  K 3.4*  CL 102  CO2 22  GLUCOSE 117*  BUN <5*  CREATININE 0.80  CALCIUM 8.0*   PT/INR No results for input(s): "LABPROT", "INR" in the last 72 hours. Comprehensive Metabolic Panel:    Component Value Date/Time   NA 134 (L) 08/19/2023 1854   NA 136 02/24/2015 0554   K 3.4 (L) 08/19/2023 1854   K 3.9 02/24/2015 0554   CL 102 08/19/2023 1854   CL 107 02/24/2015 0554   CO2 22 08/19/2023 1854   CO2 23 02/24/2015 0554   BUN <5 (L) 08/19/2023 1854   BUN 9 02/24/2015 0554   CREATININE 0.80 08/19/2023 1854   CREATININE 0.59 02/24/2015 0554   GLUCOSE 117  (H) 08/19/2023 1854   GLUCOSE 101 (H) 02/24/2015 0554   CALCIUM 8.0 (L) 08/19/2023 1854   CALCIUM 7.3 (L) 02/24/2015 0554   AST 15 08/19/2023 1854   AST 21 02/24/2015 0554   ALT 16 08/19/2023 1854   ALT 24 02/24/2015 0554   ALKPHOS 73 08/19/2023 1854   ALKPHOS 72 02/24/2015 0554   BILITOT 1.3 (H) 08/19/2023 1854   BILITOT 0.9 02/24/2015 0554   PROT 5.9 (L) 08/19/2023 1854   PROT 5.2 (L) 02/24/2015 0554   ALBUMIN 2.6 (L) 08/19/2023 1854   ALBUMIN 2.3 (L) 02/24/2015 0554    Studies/Results: CT GUIDED PERITONEAL/RETROPERITONEAL FLUID DRAIN BY PERC CATH Result Date: 08/20/2023 CLINICAL DATA:  Postoperative right lower quadrant peritoneal abscess 11 days after appendectomy for ruptured acute appendicitis. EXAM: CT GUIDED CATHETER DRAINAGE OF PERITONEAL ABSCESS ANESTHESIA/SEDATION: Moderate (conscious) sedation was employed during this procedure. A total of Versed 4.0 mg and Fentanyl 200 mcg was administered intravenously. Moderate Sedation Time: 20 minutes. The patient's level of consciousness and vital signs were monitored continuously by radiology nursing throughout the procedure under my direct supervision. PROCEDURE: The procedure, risks, benefits, and alternatives were explained to the patient. Questions regarding the procedure were encouraged and answered. The patient understands and consents to the procedure. A time out was performed prior to initiating the procedure. The right lateral abdominal wall was prepped  with chlorhexidine in a sterile fashion, and a sterile drape was applied covering the operative field. A sterile gown and sterile gloves were used for the procedure. Local anesthesia was provided with 1% Lidocaine. CT was performed in a supine position to localize a right lower quadrant peritoneal abscess. Under CT guidance, an 18 gauge trocar needle was advanced to the level of the abscess. After return of fluid, a guidewire was advanced into the collection, the needle removed and the  percutaneous tract dilated. A 12 French percutaneous drainage catheter was advanced into the collection. A 20 mL fluid sample was withdrawn for culture analysis. The drain was flushed with sterile saline and attached to a suction bulb. The drain exit site was secured with a Prolene retention suture and adhesive StatLock device. RADIATION DOSE REDUCTION: This exam was performed according to the departmental dose-optimization program which includes automated exposure control, adjustment of the mA and/or kV according to patient size and/or use of iterative reconstruction technique. COMPLICATIONS: None FINDINGS: After puncture of the right lower quadrant abscess there was return of grossly purulent creamy fluid. After drain placement there is good return of purulent fluid into the suction bulb. IMPRESSION: CT-guided percutaneous catheter drainage of right lower quadrant postoperative peritoneal abscess yielding grossly purulent fluid. A fluid sample was sent for culture analysis. A 12 French drainage catheter was placed and attached to suction bulb drainage. Electronically Signed   By: Irish Lack M.D.   On: 08/20/2023 12:03      Darnell Level 08/21/2023  Patient ID: Autumn [name], female   DOB: 1986/05/01, 38 y.o.   MRN: [id number]

## 2023-08-22 ENCOUNTER — Other Ambulatory Visit (HOSPITAL_COMMUNITY): Payer: Self-pay

## 2023-08-22 MED ORDER — AMOXICILLIN-POT CLAVULANATE 875-125 MG PO TABS
1.0000 | ORAL_TABLET | Freq: Two times a day (BID) | ORAL | 0 refills | Status: AC
  Filled 2023-08-22: qty 20, 10d supply, fill #0

## 2023-08-22 MED ORDER — TRAMADOL HCL 50 MG PO TABS
50.0000 mg | ORAL_TABLET | Freq: Four times a day (QID) | ORAL | 0 refills | Status: AC | PRN
  Filled 2023-08-22: qty 20, 5d supply, fill #0

## 2023-08-22 MED ORDER — ONDANSETRON 4 MG PO TBDP
8.0000 mg | ORAL_TABLET | Freq: Four times a day (QID) | ORAL | Status: DC | PRN
  Administered 2023-08-22 (×2): 8 mg via ORAL
  Filled 2023-08-22 (×2): qty 2

## 2023-08-22 MED ORDER — SODIUM CHLORIDE 0.9% FLUSH
10.0000 mL | Freq: Three times a day (TID) | INTRAVENOUS | Status: DC
  Administered 2023-08-22: 10 mL

## 2023-08-22 MED ORDER — SODIUM CHLORIDE 0.9 % IV SOLN: 8.0000 mg | Freq: Four times a day (QID) | INTRAVENOUS | Status: DC | PRN

## 2023-08-22 MED ORDER — ONDANSETRON 8 MG PO TBDP
8.0000 mg | ORAL_TABLET | Freq: Four times a day (QID) | ORAL | 0 refills | Status: AC | PRN
  Filled 2023-08-22: qty 20, 5d supply, fill #0

## 2023-08-22 MED ORDER — SODIUM CHLORIDE 0.9% FLUSH: 5.0000 mL | Freq: Every day | INTRAVENOUS | 0 refills | Status: AC

## 2023-08-22 MED ORDER — IBUPROFEN 200 MG PO TABS: 600.0000 mg | ORAL_TABLET | Freq: Four times a day (QID) | ORAL | Status: AC | PRN

## 2023-08-22 MED ORDER — ACETAMINOPHEN 500 MG PO TABS
1000.0000 mg | ORAL_TABLET | Freq: Four times a day (QID) | ORAL | Status: AC | PRN
Start: 2023-08-22 — End: ?

## 2023-08-22 NOTE — Progress Notes (Addendum)
 Removed staples from lap sites, per Trixie Deis, PA instruction, and instructed patient on dressing changes and flushing of her JP drain. Pt. Given supplies for wound care and drain flushing at home until her next appt.

## 2023-08-22 NOTE — Discharge Summary (Signed)
 [facility] Discharge Summary   Patient ID: Autumn [name] MRN: [id number] DOB/AGE: Jun 29, 1985 38 y.o.  Admit date: 08/19/2023 Discharge date: 08/22/2023  Admitting Diagnosis: S/P open appendectomy for perforated appendicitis on 08/09/22 by Dr. Georgiana Shore at [facility]  Post-operative abscess  Discharge Diagnosis Same   Consultants Interventional radiology   Imaging: No results found.  Procedures Dr. Irish Lack (08/20/23) - CT guided percutaneous drain placement   Hospital Course:  Patient is a 38 year old female s/p open appendectomy for perforated appendicitis 08/09/23 who presented to [facility] with abdominal pain and not feeling well.  Workup showed post-operative abscess.  Patient was transferred to [facility] and IR consulted and she underwent procedure listed above. Tolerated procedure well and was transferred to the floor.  Diet was advanced as tolerated.  On 08/22/23, the patient was voiding well, tolerating diet, ambulating well, pain well controlled, vital signs stable, drain functioning well, wound stable and felt stable for discharge home. Patient follow up as outlined below and she is aware to call with questions or concerns.    I or a member of my team have reviewed this patient in the Controlled Substance Database.   Allergies as of 08/22/2023   No Known Allergies      Medication List     STOP taking these medications    oxyCODONE 5 MG immediate release tablet Commonly known as: Oxy IR/ROXICODONE       TAKE these medications    acetaminophen 500 MG tablet Commonly known as: TYLENOL Take 2 tablets (1,000 mg total) by mouth every 6 (six) hours as needed for mild pain (pain score 1-3) or fever.   amoxicillin-clavulanate 875-125 MG tablet Commonly known as: AUGMENTIN Take 1 tablet by mouth 2 (two) times daily.   ibuprofen 200 MG tablet Commonly known as: ADVIL Take 3 tablets (600 mg total) by mouth every 6 (six) hours as  needed for mild pain (pain score 1-3).   ondansetron 8 MG disintegrating tablet Commonly known as: ZOFRAN-ODT Take 1 tablet (8 mg total) by mouth every 6 (six) hours as needed for nausea. What changed:  medication strength how much to take reasons to take this   sodium chloride flush 0.9 % Soln Commonly known as: NS 5 mLs by Intracatheter route daily.   traMADol 50 MG tablet Commonly known as: ULTRAM Take 1 tablet (50 mg total) by mouth every 6 (six) hours as needed for moderate pain (pain score 4-6).          Follow-up Information     [facility], [facility]. Go on 08/29/2023.   Specialty: General Surgery Why: 10 AM RN visit for removal of remaining staples, please arrive 30 min prior to appointment time to check in. Contact information: [address] [place] [address] [phone]         Kinsinger, De Blanch, MD. Go on 09/15/2023.   Specialty: General Surgery Why: 9:20 AM, please arrive 15 min prior to appointment time to check in. Contact information: [address] [place] [address] [phone]         Alesia Morin., MD. Call.   Specialty: Surgery Why: As needed. If you decide to resume care with Dr. Georgiana Shore upon discharge, please cancel appointments with [facility]. Contact information: [address] [place] [address] [phone]         [facility] IR Imaging Follow up.   Specialty: Radiology Why: Schedulers will contact you with  date and time of follow-up appointment. Contact information: [address] [place] [address] [phone]                Signed: Juliet Rude , [facility] Surgery 08/22/2023, 4:14 PM Please see Amion for pager number during day hours 7:00am-4:30pm

## 2023-08-22 NOTE — Progress Notes (Signed)
 Transition of Care [facility]) - Inpatient Brief Assessment   Patient Details  Name: Autumn [name] MRN: [id number] Date of Birth: Jan 01, 1986  Transition of Care [facility]) CM/SW Contact:    Ronny Bacon, RN Phone Number: 08/22/2023, 3:42 PM   Clinical Narrative: Patient from home, seen at [facility] for surgical wound drainage and fever. Patient transferred here for IR drain placement. Pt has JP drain and is currently on IV abx. Patient with possible discharge today, or tomorrow, if nausea can be controlled.   Transition of Care Asessment: Insurance and Status: (P) Insurance coverage has been reviewed Patient has primary care physician: (P) Yes Home environment has been reviewed: (P) Home Prior level of function:: (P) Independent   Social Drivers of Health Review: (P) SDOH reviewed no interventions necessary Readmission risk has been reviewed: (P) Yes Transition of care needs: (P) no transition of care needs at this time

## 2023-08-22 NOTE — Progress Notes (Signed)
 Progress Note     Subjective: Pt reports nausea and inability to eat much, tolerating liquids ok. Zofran helps but feels like it is wearing off prior to next dose. Having bowel function. Reports she was having some nausea before all of this but unclear if it was related to appendix vs some other etiology.   Objective: Vital signs in last 24 hours: Temp:  [97.4 F (36.3 C)-98.5 F (36.9 C)] 97.4 F (36.3 C) (03/17 0803) Pulse Rate:  [64-73] 64 (03/17 0803) Resp:  [14-17] 14 (03/17 0331) BP: (112-123)/(67-80) 112/73 (03/17 0803) SpO2:  [97 %-99 %] 98 % (03/17 0803) Last BM Date : 08/21/23  Intake/Output from previous day: 03/16 0701 - 03/17 0700 In: 360.3 [P.O.:240; IV Piggyback:120.3] Out: 52 [Urine:2; Drains:50] Intake/Output this shift: No intake/output data recorded.  PE: General: pleasant, WD, overweight female who is laying in bed in NAD Heart: regular, rate, and rhythm.  Lungs:  Respiratory effort nonlabored Abd: soft, appropriately ttp, ND, drain present with purulent fluid, midline with inferior portion open, cellulitis improved Psych: A&Ox3 with an appropriate affect.    Lab Results:  Recent Labs    08/19/23 1854  WBC 12.1*  HGB 8.2*  HCT 27.3*  PLT 376   BMET Recent Labs    08/19/23 1854  NA 134*  K 3.4*  CL 102  CO2 22  GLUCOSE 117*  BUN <5*  CREATININE 0.80  CALCIUM 8.0*   PT/INR No results for input(s): "LABPROT", "INR" in the last 72 hours. CMP     Component Value Date/Time   NA 134 (L) 08/19/2023 1854   K 3.4 (L) 08/19/2023 1854   CL 102 08/19/2023 1854   CO2 22 08/19/2023 1854   GLUCOSE 117 (H) 08/19/2023 1854   BUN <5 (L) 08/19/2023 1854   CREATININE 0.80 08/19/2023 1854   CALCIUM 8.0 (L) 08/19/2023 1854   PROT 5.9 (L) 08/19/2023 1854   ALBUMIN 2.6 (L) 08/19/2023 1854   AST 15 08/19/2023 1854   ALT 16 08/19/2023 1854   ALKPHOS 73 08/19/2023 1854   BILITOT 1.3 (H) 08/19/2023 1854   GFRNONAA >60 08/19/2023 1854   GFRAA >60  02/24/2015 0554   Lipase  No results found for: "LIPASE"     Studies/Results: CT GUIDED PERITONEAL/RETROPERITONEAL FLUID DRAIN BY PERC CATH Result Date: 08/20/2023 CLINICAL DATA:  Postoperative right lower quadrant peritoneal abscess 11 days after appendectomy for ruptured acute appendicitis. EXAM: CT GUIDED CATHETER DRAINAGE OF PERITONEAL ABSCESS ANESTHESIA/SEDATION: Moderate (conscious) sedation was employed during this procedure. A total of Versed 4.0 mg and Fentanyl 200 mcg was administered intravenously. Moderate Sedation Time: 20 minutes. The patient's level of consciousness and vital signs were monitored continuously by radiology nursing throughout the procedure under my direct supervision. PROCEDURE: The procedure, risks, benefits, and alternatives were explained to the patient. Questions regarding the procedure were encouraged and answered. The patient understands and consents to the procedure. A time out was performed prior to initiating the procedure. The right lateral abdominal wall was prepped with chlorhexidine in a sterile fashion, and a sterile drape was applied covering the operative field. A sterile gown and sterile gloves were used for the procedure. Local anesthesia was provided with 1% Lidocaine. CT was performed in a supine position to localize a right lower quadrant peritoneal abscess. Under CT guidance, an 18 gauge trocar needle was advanced to the level of the abscess. After return of fluid, a guidewire was advanced into the collection, the needle removed and the percutaneous tract dilated.  A 12 French percutaneous drainage catheter was advanced into the collection. A 20 mL fluid sample was withdrawn for culture analysis. The drain was flushed with sterile saline and attached to a suction bulb. The drain exit site was secured with a Prolene retention suture and adhesive StatLock device. RADIATION DOSE REDUCTION: This exam was performed according to the departmental  dose-optimization program which includes automated exposure control, adjustment of the mA and/or kV according to patient size and/or use of iterative reconstruction technique. COMPLICATIONS: None FINDINGS: After puncture of the right lower quadrant abscess there was return of grossly purulent creamy fluid. After drain placement there is good return of purulent fluid into the suction bulb. IMPRESSION: CT-guided percutaneous catheter drainage of right lower quadrant postoperative peritoneal abscess yielding grossly purulent fluid. A fluid sample was sent for culture analysis. A 12 French drainage catheter was placed and attached to suction bulb drainage. Electronically Signed   By: Irish Lack M.D.   On: 08/20/2023 12:03    Anti-infectives: Anti-infectives (From admission, onward)    Start     Dose/Rate Route Frequency Ordered Stop   08/20/23 0500  piperacillin-tazobactam (ZOSYN) IVPB 3.375 g        3.375 g 12.5 mL/hr over 240 Minutes Intravenous Every 8 hours 08/19/23 2105     08/19/23 1945  piperacillin-tazobactam (ZOSYN) IVPB 3.375 g        3.375 g 100 mL/hr over 30 Minutes Intravenous  Once 08/19/23 1851 08/19/23 2128        Assessment/Plan   S/P lap converted to open appendectomy at [facility] 08/08/23 Dr. Georgiana Shore  Post-operative abscess - S/P IR drain placement - purulent fluid in bulb, Cx pending  - removed staples from inferior midline incision - BID WTD dressing packing into tract superiorly  - continue abx - increased nausea - check EKG but increased zofran - will check on patient this afternoon, could potentially discharge later today vs tomorrow pending improvement in nausea   FEN: Reg diet  VTE: SQH ID: [id number] 3/14>>   LOS: 3 days   I reviewed Consultant IR notes, last 24 h vitals and pain scores, last 48 h intake and output, last 24 h labs and trends, and last 24 h imaging results.  This care required moderate level of medical decision making.    Juliet Rude, [facility] Surgery 08/22/2023, 9:04 AM Please see Amion for pager number during day hours 7:00am-4:30pm

## 2023-08-22 NOTE — Care Management Important Message (Signed)
 Important Message  Patient Details  Name: Autumn [name] MRN: [id number] Date of Birth: [birth year]   Important Message Given:  Yes - [employer] IM     Sherilyn Banker 08/22/2023, 12:49 PM

## 2023-08-22 NOTE — Discharge Instructions (Addendum)
 WOUND CARE: - dressing to be changed twice daily - supplies: sterile saline, kerlix/guaze, scissors, ABD pads, tape  - remove dressing and all packing carefully, moistening with sterile saline as needed to avoid packing/internal dressing sticking to the wound. - clean edges of skin around the wound with water/gauze, making sure there is no tape debris or leakage left on skin that could cause skin irritation or breakdown. - dampen clean kerlix/gauze with sterile saline and pack wound from wound base to skin level, making sure to take note of any possible areas of wound tracking, tunneling and packing appropriately. Wound can be packed loosely. Trim kerlix/gauze to size if a whole roll/piece is not required. - cover wound with a dry ABD pad and secure with tape.  - write the date/time on the dry dressing/tape to better track when the last dressing change occurred. - apply any skin protectant/powder recommended by clinician to protect skin/skin folds. - change dressing as needed if leakage occurs, wound gets contaminated, or patient requests to shower. - patient may shower daily with wound open and following the shower the wound should be dried and a clean dressing placed.          Interventional Radiology Percutaneous Abscess Drain Placement After Care   This sheet gives you information about how to care for yourself after your procedure. Your health care provider may also give you more specific instructions. Your drain was placed by an interventional radiologist with [facility] Radiology. If you have questions or concerns, contact [facility] Radiology at [phone].   Check your insertion site when you change the bandage. Check for:  More redness, swelling, or pain.  More fluid or blood.  Warmth.  Pus or a bad smell.   Catheter care  Flush the catheter once per day with 5 mL of 0.9% normal saline unless you are told otherwise by your healthcare provider. This helps to prevent clogs in  the catheter.  To disconnect the drain, turn the clear plastic tube to the left. Attach the saline syringe by placing it on the white end of the drain and turning gently to the right. Once attached gently push the plunger to the 5 mL mark. After you are done flushing, disconnect the syringe by turning to the left and reattach your drainage container  If you have a bulb please be sure the bulb is charged after reconnecting it - to do this pinch the bulb between your thumb and first finger and close the stopper located on the top of the bulb.   Check for fluid leaking from around your catheter (instead of fluid draining through your catheter). This may be a sign that the drain is no longer working correctly.  Write down the following information every time you empty your bag:   The date and time.   The amount of drainage.    Get help right away if:  Your catheter comes out.  You suddenly stop having drainage from your catheter.  You suddenly have blood in the fluid that is draining from your catheter.  You become dizzy or you faint.  You develop a rash.  You have nausea or vomiting.  You have difficulty breathing or you feel short of breath.  You develop chest pain.  You have problems with your speech or vision.  You have trouble balancing or moving your arms or legs.    Interventional Radiology Drain Record   Empty your drain at least once per day. You may empty it as  often as needed. Use this form to write down the amount of fluid that has collected in the drainage container. Bring this form with you to your follow-up visits. Please call [facility] Radiology at [phone] with any questions or concerns prior to your appointment.   Drain #1 location: ____Right upper abdomen____   Date __________ Time __________ Amount __________   Date __________ Time __________ Amount __________   Date __________ Time __________ Amount __________   Date __________ Time __________ Amount  __________   Date __________ Time __________ Amount __________   Date __________ Time __________ Amount __________   Date __________ Time __________ Amount __________   Date __________ Time __________ Amount __________   Date __________ Time __________ Amount __________   Date __________ Time __________ Amount __________   Date __________ Time __________ Amount __________   Date __________ Time __________ Amount __________   Date __________ Time __________ Amount __________   Date __________ Time __________ Amount __________

## 2023-08-22 NOTE — Plan of Care (Signed)
   Problem: Education: Goal: Knowledge of General Education information will improve Description: Including pain rating scale, medication(s)/side effects and non-pharmacologic comfort measures Outcome: Progressing   Problem: Coping: Goal: Level of anxiety will decrease Outcome: Progressing

## 2023-08-23 ENCOUNTER — Other Ambulatory Visit: Payer: Self-pay | Admitting: General Surgery

## 2023-08-23 DIAGNOSIS — B999 Unspecified infectious disease: Secondary | ICD-10-CM

## 2023-08-24 LAB — AEROBIC/ANAEROBIC CULTURE W GRAM STAIN (SURGICAL/DEEP WOUND): Special Requests: NORMAL

## 2023-08-31 NOTE — Progress Notes (Signed)
 Referring Physician(s): Dr. Sharmon Revere. [place]  Chief Complaint: The patient is seen in follow up today s/p appendectomy with post-op fluid collection; drain placement in IR 08/20/23.   History of present illness: Autumn [name] is a 38 year old female with a history of perforated appendicitis s/p open appendectomy 08/08/23 by Dr. Terrilee Files at [facility]. She remained inpatient for a few days post-procedure and was discharged home 08/10/23.   She presented to the ED at [facility] 08/18/23 for evaluation of her incision site which was draining a large volume of fluid. She also had a temperature of 101.5. CT imaging showed an intra-abdominal fluid collection in the surgical bed of the appendectomy. Surgery was consulted and a transfer to [facility] was recommended for intervention.   IR was consulted for drain placement and she received a RLQ drain 08/20/23. She was discharged from the hospital 08/22/23 with outpatient Surgery and IR follow ups. She presents today to the outpatient IR clinic for a drain evaluation with CT imaging and contrast drain injection under fluoroscopy.   Past Medical History:  Diagnosis Date   Anxiety    Hypertension     Past Surgical History:  Procedure Laterality Date   CESAREAN SECTION     CESAREAN SECTION  02/22/2015   Procedure: CESAREAN SECTION;  Surgeon: Christeen Douglas, MD;  Location: [facility] ORS;  Service: Obstetrics;;   TONSILLECTOMY     TUBAL LIGATION Bilateral 02/22/2015   Procedure: BILATERAL TUBAL LIGATION;  Surgeon: Christeen Douglas, MD;  Location: [facility] ORS;  Service: Obstetrics;  Laterality: Bilateral;    Allergies: Patient has no known allergies.  Medications: Prior to Admission medications   Medication Sig Start Date End Date Taking? Authorizing Provider  acetaminophen (TYLENOL) 500 MG tablet Take 2 tablets (1,000 mg total) by mouth every 6 (six) hours as needed for mild pain (pain score 1-3) or fever. 08/22/23   Juliet Rude, PA-C  amoxicillin-clavulanate (AUGMENTIN) 875-125 MG tablet Take 1 tablet by mouth 2 (two) times daily. 08/22/23   Juliet Rude, PA-C  ibuprofen (ADVIL) 200 MG tablet Take 3 tablets (600 mg total) by mouth every 6 (six) hours as needed for mild pain (pain score 1-3). 08/22/23   Juliet Rude, PA-C  ondansetron (ZOFRAN-ODT) 8 MG disintegrating tablet Take 1 tablet (8 mg total) by mouth every 6 (six) hours as needed for nausea. 08/22/23   Juliet Rude, PA-C  sodium chloride flush (NS) 0.9 % SOLN 5 mLs by Intracatheter route daily. 08/22/23   Juliet Rude, PA-C  traMADol (ULTRAM) 50 MG tablet Take 1 tablet (50 mg total) by mouth every 6 (six) hours as needed for moderate pain (pain score 4-6). 08/22/23   Juliet Rude, PA-C     No family history on file.  Social History   Socioeconomic History   Marital status: Married    Spouse name: Not on file   Number of children: Not on file   Years of education: Not on file   Highest education level: Not on file  Occupational History   Not on file  Tobacco Use   Smoking status: Never   Smokeless tobacco: Never  Substance and Sexual Activity   Alcohol use: No   Drug use: No   Sexual activity: Yes    Birth control/protection: Surgical  Other Topics Concern   Not on file  Social History Narrative   Not on file   Social Drivers of Health   Financial Resource Strain: Not on file  Food Insecurity: No Food Insecurity (08/19/2023)   Hunger Vital Sign    Worried About Running Out of Food in the Last Year: Never true    Ran Out of Food in the Last Year: Never true  Transportation Needs: No Transportation Needs (08/19/2023)   PRAPARE - Administrator, Civil Service (Medical): No    Lack of Transportation (Non-Medical): No  Physical Activity: Not on file  Stress: Not on file  Social Connections: Not on file     Vital Signs: There were no vitals taken for this visit.  Physical Exam  Imaging: No results  found.  Labs:  CBC: Recent Labs    08/19/23 1854  WBC 12.1*  HGB 8.2*  HCT 27.3*  PLT 376    COAGS: No results for input(s): "INR", "APTT" in the last 8760 hours.  BMP: Recent Labs    08/19/23 1854  NA 134*  K 3.4*  CL 102  CO2 22  GLUCOSE 117*  BUN <5*  CALCIUM 8.0*  CREATININE 0.80  GFRNONAA >60    LIVER FUNCTION TESTS: Recent Labs    08/19/23 1854  BILITOT 1.3*  AST 15  ALT 16  ALKPHOS 73  PROT 5.9*  ALBUMIN 2.6*    Assessment and Plan:  38 year old female with a history of perforated appendicitis s/p open appendectomy. Post-op course complicated by an intra-abdominal fluid collection in the appendectomy bed and she was seen in IR 08/20/23 for RLQ drain placement.   Electronically Signed: Mickie Kay 08/31/2023, 11:56 AM   I spent a total of 25 Minutes in face to face in clinical consultation, greater than 50% of which was counseling/coordinating care for perforated appendicitis.

## 2023-09-02 ENCOUNTER — Ambulatory Visit
Admission: RE | Admit: 2023-09-02 | Discharge: 2023-09-02 | Disposition: A | Discharge status: Home / Self Care | Source: Ambulatory Visit | Attending: General Surgery | Admitting: General Surgery

## 2023-09-02 ENCOUNTER — Ambulatory Visit
Admission: RE | Admit: 2023-09-02 | Discharge: 2023-09-02 | Disposition: A | Discharge status: Home / Self Care | Source: Ambulatory Visit | Attending: Student | Admitting: Student

## 2023-09-02 DIAGNOSIS — B999 Unspecified infectious disease: Secondary | ICD-10-CM

## 2023-09-02 HISTORY — PX: IR RADIOLOGIST EVAL & MGMT: IMG5224

## 2023-09-02 MED ORDER — IOPAMIDOL (ISOVUE-300) INJECTION 61%
100.0000 mL | Freq: Once | INTRAVENOUS | Status: AC | PRN
  Administered 2023-09-02: 100 mL via INTRAVENOUS
